# Patient Record
Sex: Male | Born: 1985 | Race: White | Hispanic: No | Marital: Married | Smoking: Current every day smoker
Health system: Southern US, Community
[De-identification: ages and names within clinical notes are randomized; demographics above are authoritative.]

## PROBLEM LIST (undated history)

## (undated) DIAGNOSIS — K259 Gastric ulcer, unspecified as acute or chronic, without hemorrhage or perforation: Secondary | ICD-10-CM

## (undated) DIAGNOSIS — M259 Joint disorder, unspecified: Secondary | ICD-10-CM

## (undated) DIAGNOSIS — I10 Essential (primary) hypertension: Secondary | ICD-10-CM

## (undated) HISTORY — PX: HERNIA REPAIR: SHX51

## (undated) HISTORY — PX: MANDIBLE FRACTURE SURGERY: SHX706

---
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
## 2006-03-19 ENCOUNTER — Emergency Department (HOSPITAL_COMMUNITY): Admission: EM | Admit: 2006-03-19 | Discharge: 2006-03-19 | Payer: Self-pay | Admitting: Emergency Medicine

## 2006-03-23 ENCOUNTER — Ambulatory Visit: Payer: Self-pay | Admitting: Internal Medicine

## 2006-03-23 ENCOUNTER — Ambulatory Visit (HOSPITAL_COMMUNITY): Admission: RE | Admit: 2006-03-23 | Discharge: 2006-03-23 | Payer: Self-pay | Admitting: Internal Medicine

## 2009-11-04 ENCOUNTER — Emergency Department (HOSPITAL_COMMUNITY): Admission: EM | Admit: 2009-11-04 | Discharge: 2009-11-04 | Payer: Self-pay | Admitting: Emergency Medicine
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
## 2012-07-03 ENCOUNTER — Emergency Department (HOSPITAL_COMMUNITY): Payer: Self-pay

## 2012-07-03 ENCOUNTER — Encounter (HOSPITAL_COMMUNITY): Payer: Self-pay

## 2012-07-03 ENCOUNTER — Observation Stay (HOSPITAL_COMMUNITY)
Admission: EM | Admit: 2012-07-03 | Discharge: 2012-07-04 | Disposition: A | Discharge status: Home / Self Care | Payer: Self-pay | Attending: Internal Medicine | Admitting: Internal Medicine

## 2012-07-03 DIAGNOSIS — G9340 Encephalopathy, unspecified: Secondary | ICD-10-CM | POA: Insufficient documentation

## 2012-07-03 DIAGNOSIS — J9600 Acute respiratory failure, unspecified whether with hypoxia or hypercapnia: Principal | ICD-10-CM | POA: Insufficient documentation

## 2012-07-03 DIAGNOSIS — R0602 Shortness of breath: Secondary | ICD-10-CM | POA: Insufficient documentation

## 2012-07-03 DIAGNOSIS — T50901A Poisoning by unspecified drugs, medicaments and biological substances, accidental (unintentional), initial encounter: Secondary | ICD-10-CM | POA: Insufficient documentation

## 2012-07-03 HISTORY — DX: Joint disorder, unspecified: M25.9

## 2012-07-03 LAB — RAPID URINE DRUG SCREEN, HOSP PERFORMED
Amphetamines: NOT DETECTED
Barbiturates: NOT DETECTED
Opiates: POSITIVE — AB
Tetrahydrocannabinol: POSITIVE — AB

## 2012-07-03 LAB — COMPREHENSIVE METABOLIC PANEL
ALT: 27 U/L (ref 0–53)
AST: 24 U/L (ref 0–37)
Albumin: 3.5 g/dL (ref 3.5–5.2)
Alkaline Phosphatase: 80 U/L (ref 39–117)
Calcium: 8.2 mg/dL — ABNORMAL LOW (ref 8.4–10.5)
GFR calc Af Amer: >90 mL/min (ref >90)
Potassium: 3.7 mEq/L (ref 3.5–5.1)
Sodium: 142 mEq/L (ref 135–145)
Total Protein: 6.9 g/dL (ref 6.0–8.3)

## 2012-07-03 LAB — URINALYSIS, ROUTINE W REFLEX MICROSCOPIC
Glucose, UA: NEGATIVE mg/dL
Hgb urine dipstick: NEGATIVE
Ketones, ur: NEGATIVE mg/dL
Leukocytes, UA: NEGATIVE
Protein, ur: NEGATIVE mg/dL

## 2012-07-03 LAB — CBC WITH DIFFERENTIAL/PLATELET
Basophils Relative: 0 % (ref 0–1)
Eosinophils Relative: 2 % (ref 0–5)
HCT: 40.5 % (ref 39.0–52.0)
Hemoglobin: 13.7 g/dL (ref 13.0–17.0)
MCH: 30.4 pg (ref 26.0–34.0)
MCHC: 33.8 g/dL (ref 30.0–36.0)
MCV: 89.8 fL (ref 78.0–100.0)
Monocytes Absolute: 1.1 10*3/uL — ABNORMAL HIGH (ref 0.1–1.0)
Monocytes Relative: 8 % (ref 3–12)
Neutro Abs: 9.6 10*3/uL — ABNORMAL HIGH (ref 1.7–7.7)

## 2012-07-03 MED ORDER — SODIUM CHLORIDE 0.9 % IV SOLN: INTRAVENOUS | Status: DC

## 2012-07-03 MED ORDER — ONDANSETRON HCL 4 MG PO TABS: 4.0000 mg | ORAL_TABLET | Freq: Four times a day (QID) | ORAL | Status: DC | PRN

## 2012-07-03 MED ORDER — ONDANSETRON HCL 4 MG/2ML IJ SOLN: 4.0000 mg | Freq: Four times a day (QID) | INTRAMUSCULAR | Status: DC | PRN

## 2012-07-03 MED ORDER — SODIUM CHLORIDE 0.9 % IJ SOLN: 3.0000 mL | Freq: Two times a day (BID) | INTRAMUSCULAR | Status: DC

## 2012-07-03 MED ORDER — SODIUM CHLORIDE 0.9 % IV BOLUS (SEPSIS)
1000.0000 mL | Freq: Once | INTRAVENOUS | Status: AC
  Administered 2012-07-03: 1000 mL via INTRAVENOUS

## 2012-07-03 MED ORDER — ACETAMINOPHEN 650 MG RE SUPP: 650.0000 mg | Freq: Four times a day (QID) | RECTAL | Status: DC | PRN

## 2012-07-03 MED ORDER — NALOXONE HCL 1 MG/ML IJ SOLN
2.0000 mg | Freq: Once | INTRAMUSCULAR | Status: AC
  Administered 2012-07-03: 2 mg via INTRAVENOUS
  Filled 2012-07-03: qty 2

## 2012-07-03 MED ORDER — VITAMINS A & D EX OINT
TOPICAL_OINTMENT | CUTANEOUS | Status: AC
  Administered 2012-07-03
  Filled 2012-07-03: qty 5

## 2012-07-03 MED ORDER — ACETAMINOPHEN 325 MG PO TABS
650.0000 mg | ORAL_TABLET | Freq: Four times a day (QID) | ORAL | Status: DC | PRN
  Administered 2012-07-04: 650 mg via ORAL
  Filled 2012-07-03: qty 2

## 2012-07-03 MED ORDER — NALOXONE HCL 0.4 MG/ML IJ SOLN: 0.4000 mg | INTRAMUSCULAR | Status: DC | PRN

## 2012-07-03 NOTE — ED Notes (Signed)
Crawford and rn searched pts clothes. Uncapped needle located in back pocket. Needle put in sharps container. Rest of clothes checked and nothing else found.

## 2012-07-03 NOTE — ED Notes (Signed)
Family at bedside. 

## 2012-07-03 NOTE — ED Notes (Signed)
ZOX:WRUE<AV> Expected date:<BR> Expected time:<BR> Means of arrival:<BR> Comments:<BR> Unresponsive

## 2012-07-03 NOTE — ED Notes (Signed)
Patient arouses easily and dozes intermittently. Denies pain or discomfort.

## 2012-07-03 NOTE — ED Notes (Signed)
md at bedside
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
## 2012-07-03 NOTE — ED Notes (Signed)
Pt given urinal and has attempted twice to provide urine. Pt will attempted 1 more time

## 2012-07-03 NOTE — ED Provider Notes (Signed)
History  This chart was scribed for Jack Racer, MD by Bennett Scrape, ED Scribe and Burman Nieves,  ED Scribe. This patient was seen in room RESB/RESB and the patient's care was started at 4:40 PM  CSN: [id number]  Arrival date & time 07/03/12  1639   First MD Initiated Contact with Patient 07/03/12 1640     Level 5 Caveat- AMS  Chief Complaint  Patient presents with  . Drug Overdose    The history is provided by the patient. No language interpreter was used.    Jack Bush is a 27 y.o. male brought in by ambulance in a c-collar and on a LSB, who presents to the Emergency Department after being found unconscious on the bathfloor of [employer] pool hall. Bystanders saw the pt walk into the bathroom, 10 minutes later another bystander opened the door and found him laying on the floor. EMS denies seeing drug paraphernalia but nurse reports finding an uncapped needle in his back pocket. Per EMS, pt had pin point pupils and was unresponsive upon their arrival. He was given 2 mg narcan with rapid improvement and was answering questions upon arrival. Per EMS, pt had an initial episode of apnea with 8 respirations per minute but vitals have been stable otherwise. Pt denies alcohol consumption. He denies being on any daily medications and denies any illegal drug use. He denies having back pain, neck pain, CP and abdominal pain currently.   Past Medical History  Diagnosis Date  . Knee joint disorder     both knees grind when pt walks per pt    Past Surgical History  Procedure Laterality Date  . Hernia repair    . Mandible fracture surgery      Family History  Problem Relation Age of Onset  . Heart attack Father     History  Substance Use Topics  . Smoking status: Current Every Day Smoker -- 0.50 packs/day for 17 years  . Smokeless tobacco: Never Used  . Alcohol Use: No      Review of Systems  Unable to perform ROS: Mental status change    Allergies  Review of  patient's allergies indicates no known allergies.  Home Medications  No current outpatient prescriptions on file.  Triage Vitals: BP 160/83  Pulse 108  Resp 13  SpO2 93%  Physical Exam  Nursing note and vitals reviewed. Constitutional: He is oriented to person, place, and time. He appears well-developed and well-nourished. No distress. Cervical collar and backboard in place.  HENT:  Head: Normocephalic and atraumatic.  Eyes: Conjunctivae and EOM are normal.  2 mm pupils are reactive we  Neck: Neck supple. No tracheal deviation present.  Cardiovascular: Normal rate, regular rhythm and normal heart sounds.  Exam reveals no gallop and no friction rub.   No murmur heard. Pulmonary/Chest: Effort normal and breath sounds normal. No respiratory distress.  Abdominal: Soft. Bowel sounds are normal. There is no tenderness.  Musculoskeletal: Normal range of motion.  Pt moves all extremeties  Neurological: He is alert and oriented to person, place, and time.  5/5 strength throughout, drowsy but arousable    Skin: Skin is warm and dry.  Psychiatric: He has a normal mood and affect. His behavior is normal.    ED Course  Procedures (including critical care time)  DIAGNOSTIC STUDIES: Oxygen Saturation is 93% on room air, adequate by my interpretation.    COORDINATION OF CARE: 4:50 PM- Pt removed from LSB.   5:15 PM- Ordered 1,000  mL of bolus, CT cervical spine, CBC, ethanol, urine rapid drug screen and CT of head.  6:10 PM- Pt rechecked and is more drowsy. O2 sats are dropping. Will redose Narcan.  9:18 PM-Consult complete with Dr. Irene Limbo, [facility]. Patient case explained and discussed. Dr. Irene Limbo agrees to admit patient for further evaluation and treatment to step down. Call ended at 9:19 PM.   Labs Reviewed  COMPREHENSIVE METABOLIC PANEL - Abnormal; Notable for the following:    Glucose, Bld 147 (*)    Calcium 8.2 (*)    Total Bilirubin 0.1 (*)    All other  components within normal limits  CBC WITH DIFFERENTIAL - Abnormal; Notable for the following:    WBC 13.7 (*)    Neutro Abs 9.6 (*)    Monocytes Absolute 1.1 (*)    All other components within normal limits  URINE RAPID DRUG SCREEN (HOSP PERFORMED) - Abnormal; Notable for the following:    Opiates POSITIVE (*)    Cocaine POSITIVE (*)    Benzodiazepines POSITIVE (*)    Tetrahydrocannabinol POSITIVE (*)    All other components within normal limits  SALICYLATE LEVEL - Abnormal; Notable for the following:    Salicylate Lvl <2.0 (*)    All other components within normal limits  MRSA PCR SCREENING  ACETAMINOPHEN LEVEL  ETHANOL  URINALYSIS, ROUTINE W REFLEX MICROSCOPIC  COMPREHENSIVE METABOLIC PANEL  CBC   Ct Head Wo Contrast  07/03/2012  *RADIOLOGY REPORT*  Clinical Data:  Altered mental status, found unconscious on bathroom floor, pinpoint pupils, question drug overdose  CT HEAD WITHOUT CONTRAST CT CERVICAL SPINE WITHOUT CONTRAST  Technique:  Multidetector CT imaging of the head and cervical spine was performed following the standard protocol without intravenous contrast.  Multiplanar CT image reconstructions of the cervical spine were also generated.  Comparison:  CT head 09/08/2004  CT HEAD  Findings: Normal ventricular morphology. No midline shift or mass effect. Normal appearance of brain parenchyma. No intracranial hemorrhage, mass lesion, or acute infarction. Visualized paranasal sinuses and mastoid air cells clear. Bones unremarkable.  IMPRESSION: No acute intracranial abnormalities.  CT CERVICAL SPINE  Findings: Visualized skull base intact. Prevertebral soft tissues normal thickness. Vertebral body and disc space heights maintained. No acute fracture, subluxation, or bone destruction. Bony foramen grossly patent. Numerous normal upper normal-sized cervical nodes. Nasopharyngeal airway present.  IMPRESSION: No acute cervical spine abnormalities.   Original Report Authenticated By: Ulyses Southward, M.D.    Ct Cervical Spine Wo Contrast  07/03/2012  *RADIOLOGY REPORT*  Clinical Data:  Altered mental status, found unconscious on bathroom floor, pinpoint pupils, question drug overdose  CT HEAD WITHOUT CONTRAST CT CERVICAL SPINE WITHOUT CONTRAST  Technique:  Multidetector CT imaging of the head and cervical spine was performed following the standard protocol without intravenous contrast.  Multiplanar CT image reconstructions of the cervical spine were also generated.  Comparison:  CT head 09/08/2004  CT HEAD  Findings: Normal ventricular morphology. No midline shift or mass effect. Normal appearance of brain parenchyma. No intracranial hemorrhage, mass lesion, or acute infarction. Visualized paranasal sinuses and mastoid air cells clear. Bones unremarkable.  IMPRESSION: No acute intracranial abnormalities.  CT CERVICAL SPINE  Findings: Visualized skull base intact. Prevertebral soft tissues normal thickness. Vertebral body and disc space heights maintained. No acute fracture, subluxation, or bone destruction. Bony foramen grossly patent. Numerous normal upper normal-sized cervical nodes. Nasopharyngeal airway present.  IMPRESSION: No acute cervical spine abnormalities.   Original Report Authenticated By: Ulyses Southward, M.D.  1. OD (overdose of drug), initial encounter   2. Accidental drug overdose, initial encounter   3. Acute encephalopathy   4. Acute respiratory failure       MDM  I personally performed the services described in this documentation, which was scribed in my presence. The recorded information has been reviewed and is accurate.       Jack Racer, MD 07/03/12 [phone]

## 2012-07-03 NOTE — Progress Notes (Signed)
Pt noted with confusion States he just woke up and does not know what happened Cm consulted ED RN who reports pt has stated the same twice to other ED staff members and has been oriented x 2 previously.  He has had a visitor per ED RN that left.  Cm re oriented pt again and informed him he would be frequently updated and informed of his test results by [facility] ED staff Pt appreciative.  Pt states he does not have a pcp EPIC lists him as self pay without a pcp

## 2012-07-03 NOTE — ED Notes (Signed)
Patient's father called to check on patient. Patient's father is Azlaan Isidore [phone].

## 2012-07-03 NOTE — ED Notes (Signed)
Per EMS pt was found unresponsive at [facility] pool hall, pt was given narcan 2mg  with pt awaken, pt denies drug or ETOH use, states just got out of jail 2days ago. CBG 217

## 2012-07-04 ENCOUNTER — Observation Stay (HOSPITAL_COMMUNITY): Payer: Self-pay

## 2012-07-04 LAB — COMPREHENSIVE METABOLIC PANEL
ALT: 27 U/L (ref 0–53)
BUN: 8 mg/dL (ref 6–23)
Calcium: 8.5 mg/dL (ref 8.4–10.5)
Creatinine, Ser: 0.95 mg/dL (ref 0.50–1.35)
GFR calc Af Amer: >90 mL/min (ref >90)
GFR calc non Af Amer: >90 mL/min (ref >90)
Glucose, Bld: 93 mg/dL (ref 70–99)
Sodium: 142 mEq/L (ref 135–145)
Total Protein: 6.8 g/dL (ref 6.0–8.3)

## 2012-07-04 LAB — CBC
HCT: 38.6 % — ABNORMAL LOW (ref 39.0–52.0)
Hemoglobin: 13 g/dL (ref 13.0–17.0)
RBC: 4.28 MIL/uL (ref 4.22–5.81)
WBC: 13.6 10*3/uL — ABNORMAL HIGH (ref 4.0–10.5)

## 2012-07-04 LAB — ACETAMINOPHEN LEVEL: Acetaminophen (Tylenol), Serum: <15 ug/mL (ref 10–30)

## 2012-07-04 MED ORDER — BIOTENE DRY MOUTH MT LIQD: 15.0000 mL | Freq: Two times a day (BID) | OROMUCOSAL | Status: DC

## 2012-07-04 NOTE — Discharge Summary (Signed)
[facility]                                                                                   Jack Bush, is a 27 y.o. male  DOB Sep 24, 1985  MRN [id number].  Admission date:  07/03/2012  Discharge Date:  07/04/2012  Primary MD  No primary provider on file.  Admitting Physician  Standley Brooking, MD  Admission Diagnosis  Acute respiratory failure [518.81] Acute encephalopathy [348.30] OD (overdose of drug), initial encounter [977.9, E858.8] Accidental drug overdose, initial encounter [977.9, E858.8]  Discharge Diagnosis     Principal Problem:   Acute respiratory failure Active Problems:   Acute encephalopathy   Accidental drug overdose      Past Medical History  Diagnosis Date  . Knee joint disorder     both knees grind when pt walks per pt    Past Surgical History  Procedure Laterality Date  . Hernia repair    . Mandible fracture surgery       Recommendations for primary care physician for things to follow:      Discharge Diagnoses:   Principal Problem:   Acute respiratory failure Active Problems:   Acute encephalopathy   Accidental drug overdose    Discharge Condition: Stable   Diet recommendation: See Discharge Instructions below   Consults S.work    History of present illness and  Hospital Course:     Kindly see H&P for history of present illness and admission details, please review complete Labs, Consult reports and Test reports for all details in brief Jack Bush, is a 27 y.o. male, patient was admitted for decreased mental status due to toxic encephalopathy from polysubstance abuse, patient was recently released from prison and was found passed out in a public bathroom with a needle with pan positive urine drug screen. His head and C-spine CT scan were negative, he had mild nonspecific leukocytosis with a normal chest x-ray and UA, he is completely symptom free and back to baseline, no subjective complaints  whatsoever, denies any suicidal or homicidal ideations, he has been counseled to abstain from any recreational drug use in the future along with smoking and alcohol use. Social work will see the patient thereafter he will be released home with outpatient followup with PCP.    Today   Subjective:   Jack Bush today has no headache,no chest abdominal pain,no new weakness tingling or numbness, feels much better wants to go home today.    Objective:   Blood pressure 128/66, pulse 87, temperature 99.6 F (37.6 C), temperature source Oral, resp. rate 9, height 6\' 1"  (1.854 m), weight 102.7 kg (226 lb 6.6 oz), SpO2 97.00%.   Intake/Output Summary (Last 24 hours) at 07/04/12 0746 Last data filed at 07/04/12 0600  Gross per 24 hour  Intake    600 ml  Output   1025 ml  Net   -425 ml    Exam Awake Alert, Oriented *3, No new F.N deficits, Normal affect [place].AT,PERRAL Supple Neck,No JVD, No cervical lymphadenopathy appriciated.  Symmetrical Chest wall movement, Good air movement bilaterally, CTAB RRR,No Gallops,Rubs or new Murmurs, No Parasternal Heave +ve B.Sounds, Abd Soft,  Non tender, No organomegaly appriciated, No rebound -guarding or rigidity. No Cyanosis, Clubbing or edema, No new Rash or bruise  Data Review   Major procedures and Radiology Reports - PLEASE review detailed and final reports for all details in brief -       Ct Head Wo Contrast  07/03/2012  *RADIOLOGY REPORT*  Clinical Data:  Altered mental status, found unconscious on bathroom floor, pinpoint pupils, question drug overdose  CT HEAD WITHOUT CONTRAST CT CERVICAL SPINE WITHOUT CONTRAST  Technique:  Multidetector CT imaging of the head and cervical spine was performed following the standard protocol without intravenous contrast.  Multiplanar CT image reconstructions of the cervical spine were also generated.  Comparison:  CT head 09/08/2004  CT HEAD  Findings: Normal ventricular morphology. No midline shift or mass  effect. Normal appearance of brain parenchyma. No intracranial hemorrhage, mass lesion, or acute infarction. Visualized paranasal sinuses and mastoid air cells clear. Bones unremarkable.  IMPRESSION: No acute intracranial abnormalities.  CT CERVICAL SPINE  Findings: Visualized skull base intact. Prevertebral soft tissues normal thickness. Vertebral body and disc space heights maintained. No acute fracture, subluxation, or bone destruction. Bony foramen grossly patent. Numerous normal upper normal-sized cervical nodes. Nasopharyngeal airway present.  IMPRESSION: No acute cervical spine abnormalities.   Original Report Authenticated By: Ulyses Southward, M.D.    Ct Cervical Spine Wo Contrast  07/03/2012  *RADIOLOGY REPORT*  Clinical Data:  Altered mental status, found unconscious on bathroom floor, pinpoint pupils, question drug overdose  CT HEAD WITHOUT CONTRAST CT CERVICAL SPINE WITHOUT CONTRAST  Technique:  Multidetector CT imaging of the head and cervical spine was performed following the standard protocol without intravenous contrast.  Multiplanar CT image reconstructions of the cervical spine were also generated.  Comparison:  CT head 09/08/2004  CT HEAD  Findings: Normal ventricular morphology. No midline shift or mass effect. Normal appearance of brain parenchyma. No intracranial hemorrhage, mass lesion, or acute infarction. Visualized paranasal sinuses and mastoid air cells clear. Bones unremarkable.  IMPRESSION: No acute intracranial abnormalities.  CT CERVICAL SPINE  Findings: Visualized skull base intact. Prevertebral soft tissues normal thickness. Vertebral body and disc space heights maintained. No acute fracture, subluxation, or bone destruction. Bony foramen grossly patent. Numerous normal upper normal-sized cervical nodes. Nasopharyngeal airway present.  IMPRESSION: No acute cervical spine abnormalities.   Original Report Authenticated By: Ulyses Southward, M.D.    Dg Chest Port 1 View  07/04/2012   *RADIOLOGY REPORT*  Clinical Data: Shortness of breath.  PORTABLE CHEST - 1 VIEW  Comparison: Chest x-ray 07/28/2005.  Findings: Lung volumes are normal.  No consolidative airspace disease.  No pleural effusions.  No pneumothorax.  No pulmonary nodule or mass noted.  Pulmonary vasculature and the cardiomediastinal silhouette are within normal limits.  IMPRESSION: 1. No radiographic evidence of acute cardiopulmonary disease.   Original Report Authenticated By: Trudie Reed, M.D.     Micro Results   Follow with Primary MD in 7 days   Get CBC, CMP, checked 7 days by Primary MD and again as instructed by your Primary MD.    Get Medicines reviewed and adjusted.  Please request your Prim.MD to go over all Hospital Tests and Procedure/Radiological results at the follow up, please get all Hospital records sent to your Prim MD by signing hospital release before you go home.  Activity: As tolerated with Full fall precautions use walker/cane & assistance as needed   Diet:  Heart Healthy   For Heart failure patients - Check your Weight  same time everyday, if you gain over 2 pounds, or you develop in leg swelling, experience more shortness of breath or chest pain, call your Primary MD immediately. Follow Cardiac Low Salt Diet and 1.8 lit/day fluid restriction.  Disposition Home   If you experience worsening of your admission symptoms, develop shortness of breath, life threatening emergency, suicidal or homicidal thoughts you must seek medical attention immediately by calling 911 or calling your MD immediately  if symptoms less severe.  You Must read complete instructions/literature along with all the possible adverse reactions/side effects for all the Medicines you take and that have been prescribed to you. Take any new Medicines after you have completely understood and accpet all the possible adverse reactions/side effects.   Do not drive and provide baby sitting services if your were admitted for  syncope or siezures until you have seen by Primary MD or a Neurologist and advised to do so again.  Do not drive when taking Pain medications.    Do not take more than prescribed Pain, Sleep and Anxiety Medications  Special Instructions: If you have smoked or chewed Tobacco  in the last 2 yrs please stop smoking, stop any regular Alcohol  and or any Recreational drug use.  Wear Seat belts while driving.  Recent Results (from the past 240 hour(s))  MRSA PCR SCREENING     Status: None   Collection Time    07/03/12 11:07 PM      Result Value Range Status   MRSA by PCR NEGATIVE  NEGATIVE Final   Comment:            The GeneXpert MRSA Assay (FDA     approved for NASAL specimens     only), is one component of a     comprehensive MRSA colonization     surveillance program. It is not     intended to diagnose MRSA     infection nor to guide or     monitor treatment for     MRSA infections.     CBC w Diff: Lab Results  Component Value Date   WBC 13.6* 07/04/2012   HGB 13.0 07/04/2012   HCT 38.6* 07/04/2012   PLT 304 07/04/2012   LYMPHOPCT 20 07/03/2012   MONOPCT 8 07/03/2012   EOSPCT 2 07/03/2012   BASOPCT 0 07/03/2012    CMP: Lab Results  Component Value Date   NA 142 07/04/2012   K 3.9 07/04/2012   CL 105 07/04/2012   CO2 28 07/04/2012   BUN 8 07/04/2012   CREATININE 0.95 07/04/2012   PROT 6.8 07/04/2012   ALBUMIN 3.5 07/04/2012   BILITOT 0.2* 07/04/2012   ALKPHOS 73 07/04/2012   AST 19 07/04/2012   ALT 27 07/04/2012  .   Discharge Instructions        Follow-up Information   Follow up with Primary MD. Schedule an appointment as soon as possible for a visit in 1 week.        Discharge Medications     Medication List    TAKE these medications       multivitamin with minerals Tabs  Take 1 tablet by mouth every morning.           Total Time in preparing paper work, data evaluation and todays exam - 35 minutes  Leroy Sea M.D on 07/04/2012 at 7:46  AM  [facility] Office  [phone]

## 2012-07-04 NOTE — Progress Notes (Signed)
07/04/12 0000- Nsg note: Pt attempted to void 3 times after admission to room- was successful once with 250cc. I checked post void residual with bladder scanner and it showed 795cc. NP notified and received order for i&o cath. Completed after feeling some resistance when inserting catheter and resulted in 775cc of clear,yellow urine. Will continue to monitor.

## 2012-07-04 NOTE — Progress Notes (Signed)
Pt has been discharge from the SD Unit to home. Has a ride. Pt took all belongings, discharge instruction given, discharge vital sign stable.

## 2012-07-04 NOTE — Clinical Social Work Note (Signed)
CSW attempted to see for substance abuse concerns. Pt was positive for cocaine, opiates, marijuana and benzos.  Pt left prior to seeing CSW.   Doreen Salvage, LCSW ICU/Stepdown Clinical Social Worker [facility] Cell [phone] Hours 8am-1200pm M-F

## 2016-07-26 ENCOUNTER — Ambulatory Visit (INDEPENDENT_AMBULATORY_CARE_PROVIDER_SITE_OTHER): Payer: Self-pay | Admitting: Physician Assistant

## 2021-02-22 ENCOUNTER — Inpatient Hospital Stay (HOSPITAL_COMMUNITY)
Admission: EM | Admit: 2021-02-22 | Discharge: 2021-02-24 | DRG: 382 | Disposition: A | Discharge status: Home / Self Care | Payer: Self-pay | Attending: General Surgery | Admitting: General Surgery

## 2021-02-22 ENCOUNTER — Other Ambulatory Visit: Payer: Self-pay

## 2021-02-22 ENCOUNTER — Emergency Department (HOSPITAL_COMMUNITY): Payer: Self-pay

## 2021-02-22 ENCOUNTER — Inpatient Hospital Stay (HOSPITAL_COMMUNITY): Payer: Self-pay

## 2021-02-22 ENCOUNTER — Encounter (HOSPITAL_COMMUNITY): Payer: Self-pay | Admitting: Emergency Medicine

## 2021-02-22 DIAGNOSIS — D72828 Other elevated white blood cell count: Secondary | ICD-10-CM | POA: Diagnosis present

## 2021-02-22 DIAGNOSIS — K275 Chronic or unspecified peptic ulcer, site unspecified, with perforation: Secondary | ICD-10-CM

## 2021-02-22 DIAGNOSIS — Z4659 Encounter for fitting and adjustment of other gastrointestinal appliance and device: Secondary | ICD-10-CM

## 2021-02-22 DIAGNOSIS — E876 Hypokalemia: Secondary | ICD-10-CM | POA: Diagnosis present

## 2021-02-22 DIAGNOSIS — Z20822 Contact with and (suspected) exposure to covid-19: Secondary | ICD-10-CM | POA: Diagnosis present

## 2021-02-22 DIAGNOSIS — F17210 Nicotine dependence, cigarettes, uncomplicated: Secondary | ICD-10-CM | POA: Diagnosis present

## 2021-02-22 DIAGNOSIS — K255 Chronic or unspecified gastric ulcer with perforation: Principal | ICD-10-CM | POA: Diagnosis present

## 2021-02-22 LAB — RESP PANEL BY RT-PCR (FLU A&B, COVID) ARPGX2
Influenza A by PCR: NEGATIVE
Influenza B by PCR: NEGATIVE
SARS Coronavirus 2 by RT PCR: NEGATIVE

## 2021-02-22 LAB — CBC
HCT: 46.2 % (ref 39.0–52.0)
Hemoglobin: 15.7 g/dL (ref 13.0–17.0)
MCH: 29.5 pg (ref 26.0–34.0)
MCHC: 34 g/dL (ref 30.0–36.0)
MCV: 86.7 fL (ref 80.0–100.0)
Platelets: 451 10*3/uL — ABNORMAL HIGH (ref 150–400)
RBC: 5.33 MIL/uL (ref 4.22–5.81)
RDW: 13.2 % (ref 11.5–15.5)
WBC: 17 10*3/uL — ABNORMAL HIGH (ref 4.0–10.5)
nRBC: 0 % (ref 0.0–0.2)

## 2021-02-22 LAB — COMPREHENSIVE METABOLIC PANEL
ALT: 50 U/L — ABNORMAL HIGH (ref 0–44)
AST: 28 U/L (ref 15–41)
Albumin: 4 g/dL (ref 3.5–5.0)
Alkaline Phosphatase: 81 U/L (ref 38–126)
Anion gap: 11 (ref 5–15)
BUN: 16 mg/dL (ref 6–20)
CO2: 36 mmol/L — ABNORMAL HIGH (ref 22–32)
Calcium: 9.1 mg/dL (ref 8.9–10.3)
Chloride: 90 mmol/L — ABNORMAL LOW (ref 98–111)
Creatinine, Ser: 0.86 mg/dL (ref 0.61–1.24)
GFR, Estimated: >60 mL/min (ref >60)
Glucose, Bld: 116 mg/dL — ABNORMAL HIGH (ref 70–99)
Potassium: 3 mmol/L — ABNORMAL LOW (ref 3.5–5.1)
Sodium: 137 mmol/L (ref 135–145)
Total Bilirubin: 1 mg/dL (ref 0.3–1.2)
Total Protein: 8.4 g/dL — ABNORMAL HIGH (ref 6.5–8.1)

## 2021-02-22 LAB — LIPASE, BLOOD: Lipase: 31 U/L (ref 11–51)

## 2021-02-22 MED ORDER — MORPHINE SULFATE (PF) 4 MG/ML IV SOLN
4.0000 mg | Freq: Once | INTRAVENOUS | Status: AC
  Administered 2021-02-22: 4 mg via INTRAVENOUS
  Filled 2021-02-22: qty 1

## 2021-02-22 MED ORDER — PIPERACILLIN-TAZOBACTAM 3.375 G IVPB
3.3750 g | Freq: Three times a day (TID) | INTRAVENOUS | Status: DC
  Administered 2021-02-23 – 2021-02-24 (×5): 3.375 g via INTRAVENOUS
  Filled 2021-02-22 (×6): qty 50

## 2021-02-22 MED ORDER — PANTOPRAZOLE INFUSION (NEW) - SIMPLE MED
8.0000 mg/h | INTRAVENOUS | Status: DC
Start: 2021-02-22 — End: 2021-02-23
  Administered 2021-02-22: 8 mg/h via INTRAVENOUS
  Filled 2021-02-22: qty 100
  Filled 2021-02-22: qty 80

## 2021-02-22 MED ORDER — PIPERACILLIN-TAZOBACTAM 3.375 G IVPB 30 MIN
3.3750 g | Freq: Once | INTRAVENOUS | Status: AC
  Administered 2021-02-22: 3.375 g via INTRAVENOUS
  Filled 2021-02-22: qty 50

## 2021-02-22 MED ORDER — HYDRALAZINE HCL 20 MG/ML IJ SOLN: 20.0000 mg | INTRAMUSCULAR | Status: DC | PRN

## 2021-02-22 MED ORDER — POTASSIUM CHLORIDE CRYS ER 20 MEQ PO TBCR
40.0000 meq | EXTENDED_RELEASE_TABLET | Freq: Once | ORAL | Status: AC
  Administered 2021-02-22: 40 meq via ORAL
  Filled 2021-02-22: qty 2

## 2021-02-22 MED ORDER — HYDROMORPHONE HCL 1 MG/ML IJ SOLN
1.0000 mg | Freq: Once | INTRAMUSCULAR | Status: AC
  Administered 2021-02-22: 1 mg via INTRAVENOUS
  Filled 2021-02-22: qty 1

## 2021-02-22 MED ORDER — ENOXAPARIN SODIUM 40 MG/0.4ML IJ SOSY
40.0000 mg | PREFILLED_SYRINGE | INTRAMUSCULAR | Status: DC
  Administered 2021-02-22 – 2021-02-23 (×2): 40 mg via SUBCUTANEOUS
  Filled 2021-02-22 (×2): qty 0.4

## 2021-02-22 MED ORDER — LACTATED RINGERS IV BOLUS
1000.0000 mL | Freq: Once | INTRAVENOUS | Status: AC
  Administered 2021-02-22: 1000 mL via INTRAVENOUS

## 2021-02-22 MED ORDER — IOHEXOL 350 MG/ML SOLN: 80.0000 mL | Freq: Once | INTRAVENOUS | Status: DC | PRN

## 2021-02-22 MED ORDER — ONDANSETRON HCL 4 MG/2ML IJ SOLN
4.0000 mg | Freq: Four times a day (QID) | INTRAMUSCULAR | Status: DC | PRN
  Administered 2021-02-22: 4 mg via INTRAVENOUS
  Filled 2021-02-22: qty 2

## 2021-02-22 MED ORDER — ONDANSETRON HCL 4 MG/2ML IJ SOLN
4.0000 mg | Freq: Once | INTRAMUSCULAR | Status: AC
  Administered 2021-02-22: 4 mg via INTRAVENOUS
  Filled 2021-02-22: qty 2

## 2021-02-22 MED ORDER — DIPHENHYDRAMINE HCL 25 MG PO CAPS: 25.0000 mg | ORAL_CAPSULE | Freq: Four times a day (QID) | ORAL | Status: DC | PRN

## 2021-02-22 MED ORDER — MORPHINE SULFATE (PF) 2 MG/ML IV SOLN: 1.0000 mg | INTRAVENOUS | Status: DC | PRN

## 2021-02-22 MED ORDER — ONDANSETRON 4 MG PO TBDP: 4.0000 mg | ORAL_TABLET | Freq: Four times a day (QID) | ORAL | Status: DC | PRN

## 2021-02-22 MED ORDER — PANTOPRAZOLE 80MG IVPB - SIMPLE MED
80.0000 mg | Freq: Once | INTRAVENOUS | Status: AC
  Administered 2021-02-22: 80 mg via INTRAVENOUS
  Filled 2021-02-22: qty 80

## 2021-02-22 MED ORDER — DIPHENHYDRAMINE HCL 50 MG/ML IJ SOLN: 25.0000 mg | Freq: Four times a day (QID) | INTRAMUSCULAR | Status: DC | PRN

## 2021-02-22 MED ORDER — HYDROMORPHONE HCL 1 MG/ML IJ SOLN
1.0000 mg | INTRAMUSCULAR | Status: DC | PRN
  Administered 2021-02-22 – 2021-02-23 (×3): 1 mg via INTRAVENOUS
  Filled 2021-02-22 (×4): qty 1

## 2021-02-22 MED ORDER — POTASSIUM CHLORIDE IN NACL 20-0.9 MEQ/L-% IV SOLN
INTRAVENOUS | Status: DC
  Filled 2021-02-22 (×4): qty 1000

## 2021-02-22 MED ORDER — PANTOPRAZOLE SODIUM 40 MG IV SOLR
40.0000 mg | Freq: Once | INTRAVENOUS | Status: AC
  Administered 2021-02-22: 40 mg via INTRAVENOUS
  Filled 2021-02-22: qty 40

## 2021-02-22 MED ORDER — PANTOPRAZOLE SODIUM 40 MG IV SOLR
40.0000 mg | Freq: Two times a day (BID) | INTRAVENOUS | Status: DC
Start: 2021-02-26 — End: 2021-02-24

## 2021-02-22 NOTE — ED Triage Notes (Signed)
Pt states that he has had n/v since Saturday with RUQ abdominal pain. No hx of same. Alert and oriented.

## 2021-02-22 NOTE — ED Provider Notes (Signed)
[facility] EMERGENCY DEPARTMENT Provider Note  CSN: [id number] Arrival date & time: 02/22/21 1244    History Chief Complaint  Patient presents with  . Abdominal Pain    Jack Bush is a 35 y.o. male with no significant PMH reports about a week of persistent, waxing and waning, sharp RUQ pain, worse with eating, improved with vomiting. Not associated with fever, CP, SOB or diarrhea. No prior history of same, no known liver disease (including hepatitis, has remote history of IVDA).    Past Medical History:  Diagnosis Date  . Knee joint disorder    both knees grind when pt walks per pt    Past Surgical History:  Procedure Laterality Date  . HERNIA REPAIR    . MANDIBLE FRACTURE SURGERY      Family History  Problem Relation Age of Onset  . Heart attack Father     Social History   Tobacco Use  . Smoking status: Every Day    Packs/day: 0.50    Years: 17.00    Pack years: 8.50    Types: Cigarettes  . Smokeless tobacco: Never  Substance Use Topics  . Alcohol use: No  . Drug use: Yes    Types: Marijuana, Heroin    Comment: 07/03/12 -out of jail 2 days ago - had both drugs x 1 since     Home Medications Prior to Admission medications   Medication Sig Start Date End Date Taking? Authorizing Provider  Multiple Vitamin (MULTIVITAMIN WITH MINERALS) TABS Take 1 tablet by mouth every morning.    [provider]     Allergies    Patient has no known allergies.   Review of Systems   Review of Systems A comprehensive review of systems was completed and negative except as noted in HPI.    Physical Exam BP (!) 141/97 (BP Location: Right Arm)   Pulse 85   Temp 97.9 F (36.6 C) (Oral)   Resp 12   Ht 6\' 1"  (1.854 m)   Wt 99.8 kg   SpO2 92%   BMI 29.03 kg/m   Physical Exam Vitals and nursing note reviewed.  Constitutional:      Appearance: Normal appearance.  HENT:     Head: Normocephalic and atraumatic.     Nose: Nose normal.      Mouth/Throat:     Mouth: Mucous membranes are moist.  Eyes:     Extraocular Movements: Extraocular movements intact.     Conjunctiva/sclera: Conjunctivae normal.  Cardiovascular:     Rate and Rhythm: Normal rate.  Pulmonary:     Effort: Pulmonary effort is normal.     Breath sounds: Normal breath sounds.  Abdominal:     General: Abdomen is flat.     Palpations: Abdomen is soft.     Tenderness: There is abdominal tenderness in the right upper quadrant. There is guarding. Positive signs include Murphy's sign. Negative signs include McBurney's sign.  Musculoskeletal:        General: No swelling. Normal range of motion.     Cervical back: Neck supple.  Skin:    General: Skin is warm and dry.  Neurological:     General: No focal deficit present.     Mental Status: He is alert.  Psychiatric:        Mood and Affect: Mood normal.     ED Results / Procedures / Treatments   Labs (all labs ordered are listed, but only abnormal results are displayed) Labs Reviewed  LIPASE,  BLOOD  COMPREHENSIVE METABOLIC PANEL  CBC  URINALYSIS, ROUTINE W REFLEX MICROSCOPIC    EKG None  Radiology No results found.  Procedures Procedures  Medications Ordered in the ED Medications  ondansetron (ZOFRAN) injection 4 mg (has no administration in time range)  morphine 4 MG/ML injection 4 mg (has no administration in time range)     MDM Rules/Calculators/A&P MDM Patient here with RUQ pain, positive Murphy's most concerning for biliary disease. Will check labs, send for [place], may need CT if not conclusive. Pain/nausea meds and IVF for comfort.   ED Course  I have reviewed the triage vital signs and the nursing notes.  Pertinent labs & imaging results that were available during my care of the patient were reviewed by me and considered in my medical decision making (see chart for details).  Clinical Course as of 02/22/21 1635  Tue Feb 22, 2021  1456 Patient with difficult IV access. 20ga placed  in L EJ with ultrasound guidance after unsuccessful attempts at R EJ.  [CS]  1540 CBC shows a leukocytosis. [place] with a dilated GB but no signs of cholecystitis.  [CS]  1613 CMP with mild hypokalemia, likely from poor intake. Otherwise no signs of biliary obstruction. Will send for CT to further evaluate other causes of his pain.  [CS]  1634 Care of the patient signed out to Dr. Charm Barges at the change of shift pending CT.  [CS]    Clinical Course User Index [CS] Pollyann Savoy, MD    Final Clinical Impression(s) / ED Diagnoses Final diagnoses:  None    Rx / DC Orders ED Discharge Orders     None        Pollyann Savoy, MD 02/22/21 1635

## 2021-02-22 NOTE — ED Notes (Signed)
ED TO INPATIENT HANDOFF REPORT  ED Nurse Name and Phone #: [phone], Charise Carwin, RN   S Name/Age/Gender Jack Bush 35 y.o. male Room/Bed: WA10/WA10  Code Status   Code Status: Prior  Home/SNF/Other Home Patient oriented to: self, place, time, and situation Is this baseline? Yes   Triage Complete: Triage complete  Chief Complaint Perforated gastric ulcer (HCC) [K25.5]  Triage Note Pt states that he has had n/v since Saturday with RUQ abdominal pain. No hx of same. Alert and oriented.   Allergies No Known Allergies  Level of Care/Admitting Diagnosis ED Disposition     ED Disposition  Admit   Condition  --   Comment  Hospital Area: [facility] [[id number]]  Level of Care: Med-Surg [16]  May admit patient to [facility] or [facility] if equivalent level of care is available:: No  Covid Evaluation: Asymptomatic Screening Protocol (No Symptoms)  Diagnosis: Perforated gastric ulcer (HCC) [244010]  Admitting Physician: Abigail Miyamoto [[id number]]  Attending Physician: CCS, MD [[id number]]  Estimated length of stay: 5 - 7 days  Certification:: I certify this patient will need inpatient services for at least 2 midnights          B Medical/Surgery History Past Medical History:  Diagnosis Date   Knee joint disorder    both knees grind when pt walks per pt   Past Surgical History:  Procedure Laterality Date   HERNIA REPAIR     MANDIBLE FRACTURE SURGERY       A IV Location/Drains/Wounds Patient Lines/Drains/Airways Status     Active Line/Drains/Airways     Name Placement date Placement time Site Days   Peripheral IV 02/22/21 20 G Anterior;Left External jugular 02/22/21  1501  External jugular  less than 1            Intake/Output Last 24 hours No intake or output data in the 24 hours ending 02/22/21 1947  Labs/Imaging Results for orders placed or performed during the hospital encounter of 02/22/21 (from the past 48 hour(s))  Lipase,  blood     Status: None   Collection Time: 02/22/21  3:09 PM  Result Value Ref Range   Lipase 31 11 - 51 U/L    Comment: Performed at [facility], [address], [place], [place] [address]  Comprehensive metabolic panel     Status: Abnormal   Collection Time: 02/22/21  3:09 PM  Result Value Ref Range   Sodium 137 135 - 145 mmol/L   Potassium 3.0 (L) 3.5 - 5.1 mmol/L   Chloride 90 (L) 98 - 111 mmol/L   CO2 36 (H) 22 - 32 mmol/L   Glucose, Bld 116 (H) 70 - 99 mg/dL    Comment: Glucose reference range applies only to samples taken after fasting for at least 8 hours.   BUN 16 6 - 20 mg/dL   Creatinine, Ser 6.64 0.61 - 1.24 mg/dL   Calcium 9.1 8.9 - 40.3 mg/dL   Total Protein 8.4 (H) 6.5 - 8.1 g/dL   Albumin 4.0 3.5 - 5.0 g/dL   AST 28 15 - 41 U/L   ALT 50 (H) 0 - 44 U/L   Alkaline Phosphatase 81 38 - 126 U/L   Total Bilirubin 1.0 0.3 - 1.2 mg/dL   GFR, Estimated >47 >42 mL/min    Comment: (NOTE) Calculated using the CKD-EPI Creatinine Equation (2021)    Anion gap 11 5 - 15    Comment: Performed at [facility], [address] [place], [place] [address]  CBC     Status: Abnormal   Collection Time: 02/22/21  3:09 PM  Result Value Ref Range   WBC 17.0 (H) 4.0 - 10.5 K/uL   RBC 5.33 4.22 - 5.81 MIL/uL   Hemoglobin 15.7 13.0 - 17.0 g/dL   HCT 20.9 47.0 - 96.2 %   MCV 86.7 80.0 - 100.0 fL   MCH 29.5 26.0 - 34.0 pg   MCHC 34.0 30.0 - 36.0 g/dL   RDW 83.6 62.9 - 47.6 %   Platelets 451 (H) 150 - 400 K/uL   nRBC 0.0 0.0 - 0.2 %    Comment: Performed at [facility], [address] [place], [place] [address]  Resp Panel by RT-PCR (Flu A&B, Covid) Nasopharyngeal Swab     Status: None   Collection Time: 02/22/21  6:17 PM   Specimen: Nasopharyngeal Swab; Nasopharyngeal(NP) swabs in vial transport medium  Result Value Ref Range   SARS Coronavirus 2 by RT PCR NEGATIVE NEGATIVE    Comment: (NOTE) SARS-CoV-2 target nucleic acids  are NOT DETECTED.  The SARS-CoV-2 RNA is generally detectable in upper respiratory specimens during the acute phase of infection. The lowest concentration of SARS-CoV-2 viral copies this assay can detect is 138 copies/mL. A negative result does not preclude SARS-Cov-2 infection and should not be used as the sole basis for treatment or other patient management decisions. A negative result may occur with  improper specimen collection/handling, submission of specimen other than nasopharyngeal swab, presence of viral mutation(s) within the areas targeted by this assay, and inadequate number of viral copies(<138 copies/mL). A negative result must be combined with clinical observations, patient history, and epidemiological information. The expected result is Negative.  Fact Sheet for Patients:  BloggerCourse.com  Fact Sheet for Healthcare Providers:  SeriousBroker.it  This test is no t yet approved or cleared by the [place] FDA and  has been authorized for detection and/or diagnosis of SARS-CoV-2 by FDA under an Emergency Use Authorization (EUA). This EUA will remain  in effect (meaning this test can be used) for the duration of the COVID-19 declaration under Section 564(b)(1) of the Act, 21 U.S.C.section 360bbb-3(b)(1), unless the authorization is terminated  or revoked sooner.       Influenza A by PCR NEGATIVE NEGATIVE   Influenza B by PCR NEGATIVE NEGATIVE    Comment: (NOTE) The Xpert Xpress SARS-CoV-2/FLU/RSV plus assay is intended as an aid in the diagnosis of influenza from Nasopharyngeal swab specimens and should not be used as a sole basis for treatment. Nasal washings and aspirates are unacceptable for Xpert Xpress SARS-CoV-2/FLU/RSV testing.  Fact Sheet for Patients: BloggerCourse.com  Fact Sheet for Healthcare Providers: SeriousBroker.it  This test is not yet  approved or cleared by the [place] FDA and has been authorized for detection and/or diagnosis of SARS-CoV-2 by FDA under an Emergency Use Authorization (EUA). This EUA will remain in effect (meaning this test can be used) for the duration of the COVID-19 declaration under Section 564(b)(1) of the Act, 21 U.S.C. section 360bbb-3(b)(1), unless the authorization is terminated or revoked.  Performed at [facility], [address] [place], [place] [address]    CT ABDOMEN PELVIS WO CONTRAST  Result Date: 02/22/2021 CLINICAL DATA:  Right upper quadrant pain leukocytosis. EXAM: CT ABDOMEN AND PELVIS WITHOUT CONTRAST TECHNIQUE: Multidetector CT imaging of the abdomen and pelvis was performed following the standard protocol without IV contrast. COMPARISON:  Same day abdominal  ultrasound. FINDINGS: Lower chest: No acute abnormality. Hepatobiliary: Diffuse hepatic steatosis. Gallbladder is distended without wall thickening or pericholecystic fluid. No biliary ductal dilation. Pancreas: Inflammatory stranding along the pancreas which is favored reactive related to the perforated peptic ulcer. No pancreatic ductal dilation. Spleen: Within normal limits. Adrenals/Urinary Tract: Bilateral adrenal glands are unremarkable. No hydronephrosis. No renal, ureteral or bladder calculus visualized. Urinary bladder is unremarkable for degree of distension. Stomach/Bowel: There is a defect in the gastric antrum with contained gas and fluid in the lesser sac of the stomach pelvis well as adjacent inflammatory stranding which extends into the retroperitoneum and along the mesenteric root. Vascular/Lymphatic: No abdominal aortic aneurysm. No pathologically enlarged abdominal or pelvic lymph nodes. Reproductive: Prostate is unremarkable. Other: No abdominal wall hernia. Musculoskeletal: No acute or significant osseous findings. IMPRESSION: 1. Perforated peptic ulcer with contained gas and fluid in the  lesser sac of the stomach and adjacent inflammatory stranding which extends into the retroperitoneum and along the mesenteric root. 2. Inflammatory stranding along the pancreas is favored reactive related to the perforated peptic ulcer. 3. Diffuse hepatic steatosis. These results were called by telephone at the time of interpretation on 02/22/2021 at 5:03 pm to provider [facility] , who verbally acknowledged these results. Electronically Signed   By: Maudry Mayhew M.D.   On: 02/22/2021 17:05   US Abdomen Limited  Result Date: 02/22/2021 CLINICAL DATA:  RUQ pain, vomiting, eval biliary disease EXAM: ULTRASOUND ABDOMEN LIMITED RIGHT UPPER QUADRANT COMPARISON:  None. FINDINGS: Gallbladder: No gallstones or wall thickening visualized. Gallbladder is distended without pericholecystic fluid. No sonographic Murphy sign noted by sonographer. Common bile duct: Diameter: 5 mm, within normal limits Liver: No focal lesion identified. Diffusely increased hepatic parenchymal echogenicity. Portal vein is patent on color Doppler imaging with normal direction of blood flow towards the liver. Other: None. IMPRESSION: 1. Hepatic steatosis. 2. No sonographic evidence of acute cholecystitis. Gallbladder is distended which can be seen in NPO state. Electronically Signed   By: Meda Klinefelter M.D.   On: 02/22/2021 14:44    Pending Labs Unresulted Labs (From admission, onward)     Start     Ordered   02/22/21 1259  Urinalysis, Routine w reflex microscopic  Once,   STAT        02/22/21 1258   Signed and Held  HIV Antibody (routine testing w rflx)  (HIV Antibody (Routine testing w reflex) panel)  Once,   R        Signed and Held   Signed and Held  Basic metabolic panel  Tomorrow morning,   R        Signed and Held   Signed and Held  CBC  Tomorrow morning,   R        Signed and Held            Vitals/Pain Today's Vitals   02/22/21 1900 02/22/21 1915 02/22/21 1930 02/22/21 1945  BP: (!) 133/94 (!) 148/99  (!) 148/84 (!) 147/99  Pulse: 88 70 74 76  Resp: 13 (!) 4 20 13   Temp:    97.8 F (36.6 C)  TempSrc:    Oral  SpO2: 99% 100% 97% 100%  Weight:      Height:      PainSc:        Isolation Precautions No active isolations  Medications Medications  iohexol (OMNIPAQUE) 350 MG/ML injection 80 mL (has no administration in time range)  ondansetron (ZOFRAN) injection 4 mg (4 mg Intravenous Given  02/22/21 1501)  morphine 4 MG/ML injection 4 mg (4 mg Intravenous Given 02/22/21 1502)  lactated ringers bolus 1,000 mL (1,000 mLs Intravenous New Bag/Given 02/22/21 1531)  potassium chloride SA (KLOR-CON) CR tablet 40 mEq (40 mEq Oral Given 02/22/21 1645)  piperacillin-tazobactam (ZOSYN) IVPB 3.375 g (3.375 g Intravenous New Bag/Given 02/22/21 1800)  pantoprazole (PROTONIX) injection 40 mg (40 mg Intravenous Given 02/22/21 1801)  HYDROmorphone (DILAUDID) injection 1 mg (1 mg Intravenous Given 02/22/21 1801)    Mobility walks Low fall risk   Focused Assessments    R Recommendations: See Admitting Provider Note  Report given to:   Additional Notes:

## 2021-02-22 NOTE — ED Notes (Signed)
Per report from day shift "Uncertain if patient will need an NG tube" will confirm with admitting doctor before proceeding.

## 2021-02-22 NOTE — ED Notes (Signed)
EDP at the bedside for US guided IV start.

## 2021-02-22 NOTE — ED Notes (Signed)
Pt to CT at this time.

## 2021-02-22 NOTE — Progress Notes (Addendum)
NGT Placed #16. Checked via air placement by 2 RN's. Ordered placed  for bedside X Ray to verify NGT placement.

## 2021-02-22 NOTE — ED Provider Notes (Signed)
Signout from Dr. Renae Gloss.  35 year old male here with right upper quadrant abdominal pain x1 week.  Labs showing elevated white count low potassium.  Ultrasound unremarkable.  Pending CT abdomen and pelvis.  Disposition per results of CT.  Physical Exam  BP (!) 161/104   Pulse 67   Temp 97.9 F (36.6 C) (Oral)   Resp 16   Ht 6\' 1"  (1.854 m)   Wt 99.8 kg   SpO2 99%   BMI 29.03 kg/m   Physical Exam  ED Course/Procedures   Clinical Course as of 02/22/21 1711  Tue Feb 22, 2021  1456 Patient with difficult IV access. 20ga placed in L EJ with ultrasound guidance after unsuccessful attempts at R EJ.  [CS]  1540 CBC shows a leukocytosis. Feb 24, 2021 with a dilated GB but no signs of cholecystitis.  [CS]  1613 CMP with mild hypokalemia, likely from poor intake. Otherwise no signs of biliary obstruction. Will send for CT to further evaluate other causes of his pain.  [CS]  1634 Care of the patient signed out to Dr. [place] at the change of shift pending CT.  [CS]    Clinical Course User Index [CS] Charm Barges, MD    Procedures  MDM  Received a call from radiology that patient has a perforated gastric ulcer.  I reviewed the history with the patient.  He is a smoker denies any alcohol or regular NSAID use.  Pains been going on over a week.  Rates the pain as 9 out of 10 currently.  Will make NPO.  PPI ordered.  Antibiotics ordered.  More pain medicine.  General surgery consulted.  1710 -discussed with Dr. Pollyann Savoy general surgery who will evaluate the patient.       Magnus Ivan, MD 02/23/21 1227

## 2021-02-22 NOTE — ED Notes (Signed)
Pt is a difficult stick. EDP notified and aware. IV team consult placed. [place] tech at the bedside at this time for abd [place].

## 2021-02-22 NOTE — Progress Notes (Signed)
NGT  #16 R nare placement in stomach. Radiologists verified and recommended to advance. Advance completed.

## 2021-02-22 NOTE — ED Provider Notes (Signed)
Emergency Medicine Provider Triage Evaluation Note  Jack Bush , a 35 y.o. male  was evaluated in triage.  Pt complains of upper abdominal pain over the last week.  He reports associated intractable nausea and vomiting, subjective fever, chills.  He denies any chest pain or shortness of breath, urinary complaints, diarrhea.  Review of Systems  Positive:  Negative: See above  Physical Exam  BP (!) 145/122 (BP Location: Left Arm)   Pulse (!) 108   Temp 98.2 F (36.8 C) (Oral)   Resp 18   Ht 6\' 1"  (1.854 m)   Wt 99.8 kg   SpO2 100%   BMI 29.03 kg/m  Gen:   Awake, no distress   Resp:  Normal effort  MSK:   Moves extremities without difficulty  Other:  Upper abdominal tenderness which is worse in the right upper quadrant and epigastrium.  Bowel sounds are normal.  Medical Decision Making  Medically screening exam initiated at 1:16 PM.  Appropriate orders placed.  COUGAR IMEL was informed that the remainder of the evaluation will be completed by another provider, this initial triage assessment does not replace that evaluation, and the importance of remaining in the ED until their evaluation is complete.     Lovena Le [place], PA-C 02/22/21 1317    02/24/21, MD 02/22/21 [phone]

## 2021-02-22 NOTE — ED Notes (Signed)
General surgery at the bedside.

## 2021-02-22 NOTE — H&P (Signed)
Jack Bush is an 35 y.o. male.   Chief Complaint: RUQ abdominal pain HPI: This is a 35 year old gentleman who presents with a 10-day history of right upper quadrant abdominal pain, nausea, and vomiting.  He reports that this started suddenly over a week ago Saturday.  He describes the pain as sharp and severe but intermittent.  He has had vomiting every day.  He has used 2 large bottles of Pepto-Bismol over the past week.  He denies use of NSAIDs or other anti-inflammatories.  He has no previous history of ulcer disease.  He does smoke and has a previous history of drug use.  He presented to the emergency department earlier today.  He first had an ultrasound which ruled out gallstones.  He then had a CAT scan of his abdomen and pelvis without contrast showing a perforated gastric ulcer into the lesser sac with some air and fluid and inflammatory changes but no free air or free fluid.  He has been having bowel movements.  He reports that his pain is no worse than it was 10 days ago.  He denies fevers or chills.  Past Medical History:  Diagnosis Date   Knee joint disorder    both knees grind when pt walks per pt    Past Surgical History:  Procedure Laterality Date   HERNIA REPAIR     MANDIBLE FRACTURE SURGERY      Family History  Problem Relation Age of Onset   Heart attack Father    Social History:  reports that he has been smoking. He has a 8.50 pack-year smoking history. He has never used smokeless tobacco. 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 He reports that he does not drink alcohol.  Allergies: No Known Allergies  (Not in a hospital admission)   Results for orders placed or performed during the hospital encounter of 02/22/21 (from the past 48 hour(s))  Lipase, blood     Status: None   Collection Time: 02/22/21  3:09 PM  Result Value Ref Range   Lipase 31 11 - 51 U/L    Comment: Performed at [facility], [address],  [place], [place] [address]  Comprehensive metabolic panel     Status: Abnormal   Collection Time: 02/22/21  3:09 PM  Result Value Ref Range   Sodium 137 135 - 145 mmol/L   Potassium 3.0 (L) 3.5 - 5.1 mmol/L   Chloride 90 (L) 98 - 111 mmol/L   CO2 36 (H) 22 - 32 mmol/L   Glucose, Bld 116 (H) 70 - 99 mg/dL    Comment: Glucose reference range applies only to samples taken after fasting for at least 8 hours.   BUN 16 6 - 20 mg/dL   Creatinine, Ser 4.25 0.61 - 1.24 mg/dL   Calcium 9.1 8.9 - 95.6 mg/dL   Total Protein 8.4 (H) 6.5 - 8.1 g/dL   Albumin 4.0 3.5 - 5.0 g/dL   AST 28 15 - 41 U/L   ALT 50 (H) 0 - 44 U/L   Alkaline Phosphatase 81 38 - 126 U/L   Total Bilirubin 1.0 0.3 - 1.2 mg/dL   GFR, Estimated >38 >75 mL/min    Comment: (NOTE) Calculated using the CKD-EPI Creatinine Equation (2021)    Anion gap 11 5 - 15    Comment: Performed at [facility], [address], [place], [place] [address]  CBC     Status: Abnormal   Collection Time: 02/22/21  3:09 PM  Result Value Ref Range   WBC 17.0 (H) 4.0 - 10.5 K/uL   RBC 5.33 4.22 - 5.81 MIL/uL   Hemoglobin 15.7 13.0 - 17.0 g/dL   HCT 25.8 52.7 - 78.2 %   MCV 86.7 80.0 - 100.0 fL   MCH 29.5 26.0 - 34.0 pg   MCHC 34.0 30.0 - 36.0 g/dL   RDW 42.3 53.6 - 14.4 %   Platelets 451 (H) 150 - 400 K/uL   nRBC 0.0 0.0 - 0.2 %    Comment: Performed at [facility], [address], [place], [place] [address]   CT ABDOMEN PELVIS WO CONTRAST  Result Date: 02/22/2021 CLINICAL DATA:  Right upper quadrant pain leukocytosis. EXAM: CT ABDOMEN AND PELVIS WITHOUT CONTRAST TECHNIQUE: Multidetector CT imaging of the abdomen and pelvis was performed following the standard protocol without IV contrast. COMPARISON:  Same day abdominal ultrasound. FINDINGS: Lower chest: No acute abnormality. Hepatobiliary: Diffuse hepatic steatosis. Gallbladder is distended without wall thickening or pericholecystic fluid. No biliary ductal  dilation. Pancreas: Inflammatory stranding along the pancreas which is favored reactive related to the perforated peptic ulcer. No pancreatic ductal dilation. Spleen: Within normal limits. Adrenals/Urinary Tract: Bilateral adrenal glands are unremarkable. No hydronephrosis. No renal, ureteral or bladder calculus visualized. Urinary bladder is unremarkable for degree of distension. Stomach/Bowel: There is a defect in the gastric antrum with contained gas and fluid in the lesser sac of the stomach pelvis well as adjacent inflammatory stranding which extends into the retroperitoneum and along the mesenteric root. Vascular/Lymphatic: No abdominal aortic aneurysm. No pathologically enlarged abdominal or pelvic lymph nodes. Reproductive: Prostate is unremarkable. Other: No abdominal wall hernia. Musculoskeletal: No acute or significant osseous findings. IMPRESSION: 1. Perforated peptic ulcer with contained gas and fluid in the lesser sac of the stomach and adjacent inflammatory stranding which extends into the retroperitoneum and along the mesenteric root. 2. Inflammatory stranding along the pancreas is favored reactive related to the perforated peptic ulcer. 3. Diffuse hepatic steatosis. These results were called by telephone at the time of interpretation on 02/22/2021 at 5:03 pm to provider [facility] , who verbally acknowledged these results. Electronically Signed   By: Maudry Mayhew M.D.   On: 02/22/2021 17:05   US Abdomen Limited  Result Date: 02/22/2021 CLINICAL DATA:  RUQ pain, vomiting, eval biliary disease EXAM: ULTRASOUND ABDOMEN LIMITED RIGHT UPPER QUADRANT COMPARISON:  None. FINDINGS: Gallbladder: No gallstones or wall thickening visualized. Gallbladder is distended without pericholecystic fluid. No sonographic Murphy sign noted by sonographer. Common bile duct: Diameter: 5 mm, within normal limits Liver: No focal lesion identified. Diffusely increased hepatic parenchymal echogenicity. Portal vein is  patent on color Doppler imaging with normal direction of blood flow towards the liver. Other: None. IMPRESSION: 1. Hepatic steatosis. 2. No sonographic evidence of acute cholecystitis. Gallbladder is distended which can be seen in NPO state. Electronically Signed   By: Meda Klinefelter M.D.   On: 02/22/2021 14:44    Review of Systems  All other systems reviewed and are negative.  Blood pressure (!) 158/100, pulse 65, temperature 97.9 F (36.6 C), temperature source Oral, resp. rate 13, height 6\' 1"  (1.854 m), weight 99.8 kg, SpO2 98 %. Physical Exam Constitutional:      Appearance: He is not ill-appearing.     Comments: He appears uncomfortable  HENT:     Head: Normocephalic and atraumatic.     Mouth/Throat:     Comments: Dry Cardiovascular:     Rate and Rhythm:  Normal rate and regular rhythm.     Heart sounds: Normal heart sounds. No murmur heard. Pulmonary:     Effort: Pulmonary effort is normal. No respiratory distress.     Breath sounds: Normal breath sounds.  Abdominal:     General: Abdomen is flat.     Hernia: No hernia is present.     Comments: His abdomen is soft and surprisingly minimally tender mostly in the epigastrium with only mild guarding.  The rest of his abdomen is nontender.  He is not rigid and there is no gross peritonitis  Skin:    General: Skin is warm and dry.     Coloration: Skin is not jaundiced.  Neurological:     General: No focal deficit present.     Mental Status: He is alert and oriented to person, place, and time.  Psychiatric:        Mood and Affect: Mood is anxious.        Behavior: Behavior normal.     Assessment/Plan Perforated gastric ulcer  I have reviewed his CT scan of the abdomen pelvis and his laboratory data.  He appears to have a contained perforation of a gastric ulcer into the lesser sac.  I discussed this with the patient and his wife.  I have also discussed the CT findings with my partner Dr. Janee Morn.  Given this is now 10 days  into his symptoms and I suspect that he perforated at the start of this and given his exam of the abdomen, I would like to attempt nonoperative management with nasogastric suctioning, bowel rest, and IV antibiotics.  He understands that if he deteriorates or nonoperative management fails that he would require an exploratory laparotomy.  I will place him on IV Protonix as well.  Hopefully he will improve without the need for acute surgical intervention and then an upper endoscopy can be performed in 6 to 8 weeks.  Should he acutely worsen this evening, we will proceed to the operating room.  He and his wife agree with the plans.  Abigail Miyamoto, MD 02/22/2021, 6:26 PM

## 2021-02-22 NOTE — ED Notes (Addendum)
Hospitalist contacted in regards to conflicting orders with report given by day shift RN regarding placement of NG tube.

## 2021-02-23 ENCOUNTER — Inpatient Hospital Stay (HOSPITAL_COMMUNITY): Payer: Self-pay

## 2021-02-23 LAB — CBC
HCT: 46.7 % (ref 39.0–52.0)
Hemoglobin: 15 g/dL (ref 13.0–17.0)
MCH: 29.5 pg (ref 26.0–34.0)
MCHC: 32.1 g/dL (ref 30.0–36.0)
MCV: 91.7 fL (ref 80.0–100.0)
Platelets: 341 10*3/uL (ref 150–400)
RBC: 5.09 MIL/uL (ref 4.22–5.81)
RDW: 13.2 % (ref 11.5–15.5)
WBC: 14.1 10*3/uL — ABNORMAL HIGH (ref 4.0–10.5)
nRBC: 0 % (ref 0.0–0.2)

## 2021-02-23 LAB — BASIC METABOLIC PANEL
Anion gap: 10 (ref 5–15)
BUN: 18 mg/dL (ref 6–20)
CO2: 28 mmol/L (ref 22–32)
Calcium: 8.8 mg/dL — ABNORMAL LOW (ref 8.9–10.3)
Chloride: 96 mmol/L — ABNORMAL LOW (ref 98–111)
Creatinine, Ser: 1.2 mg/dL (ref 0.61–1.24)
GFR, Estimated: >60 mL/min (ref >60)
Glucose, Bld: 102 mg/dL — ABNORMAL HIGH (ref 70–99)
Potassium: 3.7 mmol/L (ref 3.5–5.1)
Sodium: 134 mmol/L — ABNORMAL LOW (ref 135–145)

## 2021-02-23 LAB — HIV ANTIBODY (ROUTINE TESTING W REFLEX): HIV Screen 4th Generation wRfx: NONREACTIVE

## 2021-02-23 MED ORDER — IOHEXOL 300 MG/ML  SOLN
100.0000 mL | Freq: Once | INTRAMUSCULAR | Status: AC | PRN
  Administered 2021-02-23: 100 mL

## 2021-02-23 MED ORDER — SODIUM CHLORIDE 0.9% FLUSH: 10.0000 mL | INTRAVENOUS | Status: DC | PRN

## 2021-02-23 MED ORDER — CHLORHEXIDINE GLUCONATE CLOTH 2 % EX PADS
6.0000 | MEDICATED_PAD | Freq: Every day | CUTANEOUS | Status: DC
  Administered 2021-02-23 – 2021-02-24 (×2): 6 via TOPICAL

## 2021-02-23 MED ORDER — SODIUM CHLORIDE 0.9% FLUSH
10.0000 mL | Freq: Two times a day (BID) | INTRAVENOUS | Status: DC
  Administered 2021-02-23 (×2): 10 mL

## 2021-02-23 NOTE — Progress Notes (Addendum)
Zosyn and protonix are listed as variable. Protonix paused while zosyn is running. Pt is is very hard stick and only has 1 IV site. Pharmacy called, and they recommend either get a second IV site or change to a different abx. PA maczis notified via phone call of this, and PA states to have IV team attempt a second line first and go from there. PA aware that protonix is paused for zosyn admin.

## 2021-02-23 NOTE — Plan of Care (Signed)

## 2021-02-23 NOTE — Progress Notes (Signed)
Subjective/Chief Complaint: Feels much better today, no n/v, no abd pain   Objective: Vital signs in last 24 hours: Temp:  [97.8 F (36.6 C)-98.6 F (37 C)] 98 F (36.7 C) (10/19 0600) Pulse Rate:  [65-108] 72 (10/19 0919) Resp:  [4-20] 18 (10/19 0919) BP: (133-167)/(84-122) 134/94 (10/19 0919) SpO2:  [92 %-100 %] 99 % (10/19 0919) Weight:  [99.8 kg] 99.8 kg (10/18 1257) Last BM Date: 02/19/21  Intake/Output from previous day: 10/18 0701 - 10/19 0700 In: 1883.6 [I.V.:792.8; IV Piggyback:1090.8] Out: 900 [Emesis/NG output:100; Stool:800] Intake/Output this shift: No intake/output data recorded.  General appearance: no distress GI: soft nontender nondistended  Lab Results:  Recent Labs    02/22/21 1509 02/23/21 0556  WBC 17.0* 14.1*  HGB 15.7 15.0  HCT 46.2 46.7  PLT 451* 341   BMET Recent Labs    02/22/21 1509 02/23/21 0556  NA 137 134*  K 3.0* 3.7  CL 90* 96*  CO2 36* 28  GLUCOSE 116* 102*  BUN 16 18  CREATININE 0.86 1.20  CALCIUM 9.1 8.8*   PT/INR No results for input(s): LABPROT, INR in the last 72 hours. ABG No results for input(s): PHART, HCO3 in the last 72 hours.  Invalid input(s): PCO2, PO2  Studies/Results: CT ABDOMEN PELVIS WO CONTRAST  Result Date: 02/22/2021 CLINICAL DATA:  Right upper quadrant pain leukocytosis. EXAM: CT ABDOMEN AND PELVIS WITHOUT CONTRAST TECHNIQUE: Multidetector CT imaging of the abdomen and pelvis was performed following the standard protocol without IV contrast. COMPARISON:  Same day abdominal ultrasound. FINDINGS: Lower chest: No acute abnormality. Hepatobiliary: Diffuse hepatic steatosis. Gallbladder is distended without wall thickening or pericholecystic fluid. No biliary ductal dilation. Pancreas: Inflammatory stranding along the pancreas which is favored reactive related to the perforated peptic ulcer. No pancreatic ductal dilation. Spleen: Within normal limits. Adrenals/Urinary Tract: Bilateral adrenal glands  are unremarkable. No hydronephrosis. No renal, ureteral or bladder calculus visualized. Urinary bladder is unremarkable for degree of distension. Stomach/Bowel: There is a defect in the gastric antrum with contained gas and fluid in the lesser sac of the stomach pelvis well as adjacent inflammatory stranding which extends into the retroperitoneum and along the mesenteric root. Vascular/Lymphatic: No abdominal aortic aneurysm. No pathologically enlarged abdominal or pelvic lymph nodes. Reproductive: Prostate is unremarkable. Other: No abdominal wall hernia. Musculoskeletal: No acute or significant osseous findings. IMPRESSION: 1. Perforated peptic ulcer with contained gas and fluid in the lesser sac of the stomach and adjacent inflammatory stranding which extends into the retroperitoneum and along the mesenteric root. 2. Inflammatory stranding along the pancreas is favored reactive related to the perforated peptic ulcer. 3. Diffuse hepatic steatosis. These results were called by telephone at the time of interpretation on 02/22/2021 at 5:03 pm to provider [facility] , who verbally acknowledged these results. Electronically Signed   By: Maudry Mayhew M.D.   On: 02/22/2021 17:05   US Abdomen Limited  Result Date: 02/22/2021 CLINICAL DATA:  RUQ pain, vomiting, eval biliary disease EXAM: ULTRASOUND ABDOMEN LIMITED RIGHT UPPER QUADRANT COMPARISON:  None. FINDINGS: Gallbladder: No gallstones or wall thickening visualized. Gallbladder is distended without pericholecystic fluid. No sonographic Murphy sign noted by sonographer. Common bile duct: Diameter: 5 mm, within normal limits Liver: No focal lesion identified. Diffusely increased hepatic parenchymal echogenicity. Portal vein is patent on color Doppler imaging with normal direction of blood flow towards the liver. Other: None. IMPRESSION: 1. Hepatic steatosis. 2. No sonographic evidence of acute cholecystitis. Gallbladder is distended which can be seen in NPO  state. Electronically Signed   By: Meda Klinefelter M.D.   On: 02/22/2021 14:44   DG Abd Portable 1V  Result Date: 02/22/2021 CLINICAL DATA:  Check gastric catheter placement EXAM: PORTABLE ABDOMEN - 1 VIEW COMPARISON:  None. FINDINGS: Gastric catheter is noted with the tip in the stomach. The proximal side port lies at the gastroesophageal junction. This should be advanced deeper into the stomach. IMPRESSION: Gastric catheter as described. This could be advanced deeper into the stomach. Electronically Signed   By: Alcide Clever M.D.   On: 02/22/2021 21:31    Anti-infectives: Anti-infectives (From admission, onward)    Start     Dose/Rate Route Frequency Ordered Stop   02/22/21 2359  piperacillin-tazobactam (ZOSYN) IVPB 3.375 g        3.375 g 12.5 mL/hr over 240 Minutes Intravenous Every 8 hours 02/22/21 2123     02/22/21 1715  piperacillin-tazobactam (ZOSYN) IVPB 3.375 g        3.375 g 100 mL/hr over 30 Minutes Intravenous  Once 02/22/21 1705 02/22/21 1947       Assessment/Plan: Perforated likely contained gastric ulcer -will get ugi today to confirm and if contained continue treat nonoperatively, no clinical indication for surgery right now -continue zosyn and protonix -lovenox and scds  Emelia Loron 02/23/2021

## 2021-02-24 LAB — CBC
HCT: 38.9 % — ABNORMAL LOW (ref 39.0–52.0)
Hemoglobin: 13 g/dL (ref 13.0–17.0)
MCH: 29.5 pg (ref 26.0–34.0)
MCHC: 33.4 g/dL (ref 30.0–36.0)
MCV: 88.2 fL (ref 80.0–100.0)
Platelets: 361 10*3/uL (ref 150–400)
RBC: 4.41 MIL/uL (ref 4.22–5.81)
RDW: 13 % (ref 11.5–15.5)
WBC: 12 10*3/uL — ABNORMAL HIGH (ref 4.0–10.5)
nRBC: 0 % (ref 0.0–0.2)

## 2021-02-24 LAB — H PYLORI, IGM, IGG, IGA AB
H Pylori IgG: 1.93 Index Value — ABNORMAL HIGH (ref 0.00–0.79)
H. Pylogi, Iga Abs: 34.3 units — ABNORMAL HIGH (ref 0.0–8.9)
H. Pylogi, Igm Abs: 11.6 units — ABNORMAL HIGH (ref 0.0–8.9)

## 2021-02-24 MED ORDER — OXYCODONE HCL 5 MG PO TABS: 5.0000 mg | ORAL_TABLET | ORAL | Status: DC | PRN

## 2021-02-24 MED ORDER — PANTOPRAZOLE SODIUM 40 MG PO TBEC: 40.0000 mg | DELAYED_RELEASE_TABLET | Freq: Two times a day (BID) | ORAL | 1 refills | Status: DC

## 2021-02-24 MED ORDER — OXYCODONE HCL 5 MG PO TABS: 5.0000 mg | ORAL_TABLET | Freq: Four times a day (QID) | ORAL | 0 refills | Status: DC | PRN

## 2021-02-24 MED ORDER — ACETAMINOPHEN 500 MG PO TABS: 1000.0000 mg | ORAL_TABLET | Freq: Four times a day (QID) | ORAL | Status: DC | PRN

## 2021-02-24 MED ORDER — AMOXICILLIN-POT CLAVULANATE 875-125 MG PO TABS: 1.0000 | ORAL_TABLET | Freq: Two times a day (BID) | ORAL | 0 refills | Status: DC

## 2021-02-24 MED ORDER — ACETAMINOPHEN 500 MG PO TABS: 1000.0000 mg | ORAL_TABLET | Freq: Three times a day (TID) | ORAL | 0 refills | Status: DC | PRN

## 2021-02-24 NOTE — Discharge Summary (Signed)
Patient ID: Jack Bush [id number] Jan 29, 1986 35 y.o.  Admit date: 02/22/2021 Discharge date: 02/24/2021  Admitting Diagnosis: Perforated gastric ulcer  Discharge Diagnosis Perforated, contained gastric ulcer  Consultants None  H&P: This is a 35 year old gentleman who presents with a 10-day history of right upper quadrant abdominal pain, nausea, and vomiting.  He reports that this started suddenly over a week ago Saturday.  He describes the pain as sharp and severe but intermittent.  He has had vomiting every day.  He has used 2 large bottles of Pepto-Bismol over the past week.  He denies use of NSAIDs or other anti-inflammatories.  He has no previous history of ulcer disease.  He does smoke and has a previous history of drug use.  He presented to the emergency department earlier today.  He first had an ultrasound which ruled out gallstones.  He then had a CAT scan of his abdomen and pelvis without contrast showing a perforated gastric ulcer into the lesser sac with some air and fluid and inflammatory changes but no free air or free fluid.  He has been having bowel movements.  He reports that his pain is no worse than it was 10 days ago.  He denies fevers or chills.  Procedures None  Hospital Course:  Patient presented as above after 10 days of pain and was found to have a perforated peptic ulcer with contained gas and fluid in the lesser sac of the stomach and adjacent inflammatory stranding which extends into the retroperitoneum and along the mesenteric root on CT. This appear contained. Decision was made to observe with nonoperative management given patient was 10 days into symptom onset. NGT was placed and patient was started on IV abx and PPI. He had a UGI on 10/19 that showed a contained perforated ulcer involving the gastric antrum. There was no free extravasation of contrast into the peritoneal cavity. NGT was discontinued and diet was advanced and tolerated. On 10/20, the  patient was tolerating diet without abdominal pain or n/v, was NT on exam, WBC downtrending, vital signs stable and felt stable for discharge home. He was discharged on abx and PPI. He is to follow up in the office as noted below. We will refer to GI for upper endoscopy can be performed in 6 to 8 weeks after his follow up appointment. His H. Pylori was pending at the time of discharge  Physical Exam: At discharge Gen: Awake and alert, NAD Lungs: normal rate and effort Abd: Soft, ND, NT  Allergies as of 02/24/2021   No Known Allergies      Medication List     TAKE these medications    acetaminophen 500 MG tablet Commonly known as: TYLENOL Take 2 tablets (1,000 mg total) by mouth every 8 (eight) hours as needed for mild pain.   amoxicillin-clavulanate 875-125 MG tablet Commonly known as: AUGMENTIN Take 1 tablet by mouth every 12 (twelve) hours.   oxyCODONE 5 MG immediate release tablet Commonly known as: Oxy IR/ROXICODONE Take 1 tablet (5 mg total) by mouth every 6 (six) hours as needed.   pantoprazole 40 MG tablet Commonly known as: Protonix Take 1 tablet (40 mg total) by mouth 2 (two) times daily.          Follow-up Information     Emelia Loron, MD Follow up on 03/11/2021.   Specialty: General Surgery Why: 1140am. Please arrive 30 minutes prior to your appointment for paperwork. Please bring a copy of your photo ID and insurance card.  Contact information: [address] [place] [address] [phone]                 Signed: Leary Roca, [facility] 02/24/2021, 2:48 PM Please see Amion for pager number during day hours 7:00am-4:30pm

## 2021-02-24 NOTE — Progress Notes (Signed)
Subjective: CC: Reports yesterday evening he started having some "achiness" in his epigastrium after getting up and walking. This resolved and he has no pain this am. Tolerating cld without abdominal pain following or associated n/v. He is passing flatus and had a bm this am.   Objective: Vital signs in last 24 hours: Temp:  [97.9 F (36.6 C)-99.7 F (37.6 C)] 98.8 F (37.1 C) (10/20 0532) Pulse Rate:  [72-99] 76 (10/20 0532) Resp:  [16-18] 16 (10/20 0532) BP: (129-141)/(78-94) 137/78 (10/20 0532) SpO2:  [97 %-100 %] 97 % (10/20 0532) Last BM Date: 02/24/21  Intake/Output from previous day: 10/19 0701 - 10/20 0700 In: 2651 [P.O.:360; I.V.:2160.5; IV Piggyback:130.5] Out: 1300 [Urine:1300] Intake/Output this shift: No intake/output data recorded.  PE: Gen:  Alert, NAD, pleasant HEENT: EOM's intact, pupils equal and round Card:  RRR Pulm:  CTAB, no W/R/R, effort normal Abd: Soft, ND, NT +BS Psych: A&Ox3  Skin: no rashes noted, warm and dry  Lab Results:  Recent Labs    02/23/21 0556 02/24/21 0408  WBC 14.1* 12.0*  HGB 15.0 13.0  HCT 46.7 38.9*  PLT 341 361   BMET Recent Labs    02/22/21 1509 02/23/21 0556  NA 137 134*  K 3.0* 3.7  CL 90* 96*  CO2 36* 28  GLUCOSE 116* 102*  BUN 16 18  CREATININE 0.86 1.20  CALCIUM 9.1 8.8*   PT/INR No results for input(s): LABPROT, INR in the last 72 hours. CMP     Component Value Date/Time   NA 134 (L) 02/23/2021 0556   K 3.7 02/23/2021 0556   CL 96 (L) 02/23/2021 0556   CO2 28 02/23/2021 0556   GLUCOSE 102 (H) 02/23/2021 0556   BUN 18 02/23/2021 0556   CREATININE 1.20 02/23/2021 0556   CALCIUM 8.8 (L) 02/23/2021 0556   PROT 8.4 (H) 02/22/2021 1509   ALBUMIN 4.0 02/22/2021 1509   AST 28 02/22/2021 1509   ALT 50 (H) 02/22/2021 1509   ALKPHOS 81 02/22/2021 1509   BILITOT 1.0 02/22/2021 1509   GFRNONAA >60 02/23/2021 0556   GFRAA >90 07/04/2012 0059   Lipase     Component Value Date/Time   LIPASE  31 02/22/2021 1509    Studies/Results: CT ABDOMEN PELVIS WO CONTRAST  Result Date: 02/22/2021 CLINICAL DATA:  Right upper quadrant pain leukocytosis. EXAM: CT ABDOMEN AND PELVIS WITHOUT CONTRAST TECHNIQUE: Multidetector CT imaging of the abdomen and pelvis was performed following the standard protocol without IV contrast. COMPARISON:  Same day abdominal ultrasound. FINDINGS: Lower chest: No acute abnormality. Hepatobiliary: Diffuse hepatic steatosis. Gallbladder is distended without wall thickening or pericholecystic fluid. No biliary ductal dilation. Pancreas: Inflammatory stranding along the pancreas which is favored reactive related to the perforated peptic ulcer. No pancreatic ductal dilation. Spleen: Within normal limits. Adrenals/Urinary Tract: Bilateral adrenal glands are unremarkable. No hydronephrosis. No renal, ureteral or bladder calculus visualized. Urinary bladder is unremarkable for degree of distension. Stomach/Bowel: There is a defect in the gastric antrum with contained gas and fluid in the lesser sac of the stomach pelvis well as adjacent inflammatory stranding which extends into the retroperitoneum and along the mesenteric root. Vascular/Lymphatic: No abdominal aortic aneurysm. No pathologically enlarged abdominal or pelvic lymph nodes. Reproductive: Prostate is unremarkable. Other: No abdominal wall hernia. Musculoskeletal: No acute or significant osseous findings. IMPRESSION: 1. Perforated peptic ulcer with contained gas and fluid in the lesser sac of the stomach and adjacent inflammatory stranding which extends into the retroperitoneum  and along the mesenteric root. 2. Inflammatory stranding along the pancreas is favored reactive related to the perforated peptic ulcer. 3. Diffuse hepatic steatosis. These results were called by telephone at the time of interpretation on 02/22/2021 at 5:03 pm to provider [facility] , who verbally acknowledged these results. Electronically Signed   By:  Maudry Mayhew M.D.   On: 02/22/2021 17:05   US Abdomen Limited  Result Date: 02/22/2021 CLINICAL DATA:  RUQ pain, vomiting, eval biliary disease EXAM: ULTRASOUND ABDOMEN LIMITED RIGHT UPPER QUADRANT COMPARISON:  None. FINDINGS: Gallbladder: No gallstones or wall thickening visualized. Gallbladder is distended without pericholecystic fluid. No sonographic Murphy sign noted by sonographer. Common bile duct: Diameter: 5 mm, within normal limits Liver: No focal lesion identified. Diffusely increased hepatic parenchymal echogenicity. Portal vein is patent on color Doppler imaging with normal direction of blood flow towards the liver. Other: None. IMPRESSION: 1. Hepatic steatosis. 2. No sonographic evidence of acute cholecystitis. Gallbladder is distended which can be seen in NPO state. Electronically Signed   By: Meda Klinefelter M.D.   On: 02/22/2021 14:44   DG Abd Portable 1V  Result Date: 02/22/2021 CLINICAL DATA:  Check gastric catheter placement EXAM: PORTABLE ABDOMEN - 1 VIEW COMPARISON:  None. FINDINGS: Gastric catheter is noted with the tip in the stomach. The proximal side port lies at the gastroesophageal junction. This should be advanced deeper into the stomach. IMPRESSION: Gastric catheter as described. This could be advanced deeper into the stomach. Electronically Signed   By: Alcide Clever M.D.   On: 02/22/2021 21:31   DG UGI W SINGLE CM (SOL OR THIN BA)  Result Date: 02/23/2021 CLINICAL DATA:  Perforated gastric ulcer. Undergoing conservative therapy with nasogastric tube placement. EXAM: UPPER GI SERIES WITH KUB TECHNIQUE: After obtaining a scout radiograph, water-soluble contrast (100 cc Omnipaque 300) was instilled via the pre-existing nasogastric tube under fluoroscopic observation. This was followed by injection of 40 cc of air. FLUOROSCOPY TIME:  Fluoroscopy Time: 6 seconds of low-dose pulsed fluoroscopy Radiation Exposure Index (if provided by the fluoroscopic device): 45.6 mGy  Number of Acquired Spot Images: 4 COMPARISON:  Abdominopelvic CT and radiographs 02/22/2021. FINDINGS: The nasogastric tube tip is positioned in the distal stomach, near the previously demonstrated ulcer. On injection of contrast, there is rapid filling of the previously demonstrated contained perforation involving the superior aspect of the gastric antrum. This collection measures up to 3.0 cm in greatest dimension and appears similar to the recent CT. There is no contrast extravasation into the peritoneal cavity. There is emptying of contrast into the duodenum which appears normal. No other focal abnormalities of the stomach or small bowel are identified. The esophagus was not examined on this examination performed via a nasogastric tube. IMPRESSION: 1. Unchanged appearance of contained perforated ulcer involving the gastric antrum. No free extravasation of contrast into the peritoneal cavity or gastric outlet obstruction identified. 2. The enteric tube tip is near the ulcer. 3. Follow-up endoscopy or CT recommended to confirm the benign nature of this ulcer given irregular gastric wall thickening and surrounding lymph nodes on recent CT. Electronically Signed   By: Carey Bullocks M.D.   On: 02/23/2021 12:45    Anti-infectives: Anti-infectives (From admission, onward)    Start     Dose/Rate Route Frequency Ordered Stop   02/22/21 2359  piperacillin-tazobactam (ZOSYN) IVPB 3.375 g        3.375 g 12.5 mL/hr over 240 Minutes Intravenous Every 8 hours 02/22/21 2123  02/22/21 1715  piperacillin-tazobactam (ZOSYN) IVPB 3.375 g        3.375 g 100 mL/hr over 30 Minutes Intravenous  Once 02/22/21 1705 02/22/21 1947        Assessment/Plan Perforated, contained gastric ulcer - CT w/ Perforated peptic ulcer with contained gas and fluid in the lesser sac of the stomach and adjacent inflammatory stranding which extends into the retroperitoneum and along the mesenteric root. - Ugi 10/19 contained  perforated ulcer involving the gastric antrum. No free extravasation of contrast into the peritoneal cavity or gastric outlet obstruction identified - No indication for surgery at this time - Cont IV abx while inpatient - He NT on exam and denies any pain. Tolerating cld. WBC down.  - Adv to FLD. Possible d/c later today on oral abx  - H. Pylori pending - Cont PPI - Transition to PO pain medications - Mobilize, pulm toilet  FEN - FLD VTE - SCDs, Lovenox ID - Zosyn    LOS: 2 days    Jacinto Halim , [facility] 02/24/2021, 8:27 AM Please see Amion for pager number during day hours 7:00am-4:30pm

## 2021-02-24 NOTE — Progress Notes (Signed)
Discharge instructions given to patient and all questions were answered.  

## 2021-02-24 NOTE — Discharge Instructions (Addendum)
Please take your antibiotic to completion Please make sure to take your Protonix as prescribed Please do not take NSAIDS (Non Steriodal Anti Inflammatory Medications such as aspirin, ibuprofen, advil, aleve, mobic, naprosyn, naproxen etc) Please do not drive or operate heavy machinery while taking narcotic pain medication (Oxycodone) if you were prescribed them Please call our office or go to the emergency department if you develop fever, vomiting, new/worsening abdominal pain or any other concerns

## 2022-07-28 IMAGING — RF DG UGI W SINGLE CM
10 of 15 series · 15 of 24 positions shown · IV contrast (omnipaque)
Comparison: Abdominopelvic CT and radiographs 02/22/2021.

CLINICAL DATA: Perforated gastric ulcer. Undergoing conservative
therapy with nasogastric tube placement.

EXAM:
UPPER GI SERIES WITH KUB
TECHNIQUE: After obtaining a scout radiograph, water-soluble contrast (100 cc
Omnipaque 300) was instilled via the pre-existing nasogastric tube
under fluoroscopic observation. This was followed by injection of 40
cc of air.
FLUOROSCOPY TIME:  Fluoroscopy Time: 6 seconds of low-dose pulsed
fluoroscopy
Radiation Exposure Index (if provided by the fluoroscopic device):
45.6 mGy
Number of Acquired Spot Images: 4

[Series 1: t abdomen supine · 0.15mm/px · 1 of 1 slices shown]
[im 1/1]
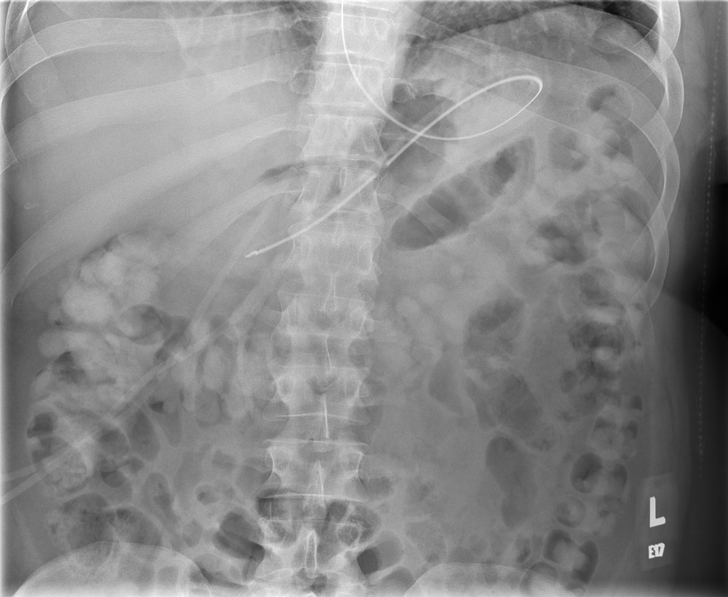

[Series 4: cp_standard · 0.54mm/px · 2 of 27 frames shown (1 of 6)]
[frame 5/27]
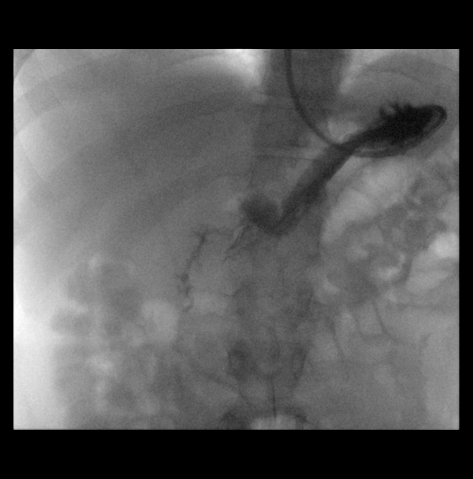
[frame 18/27]
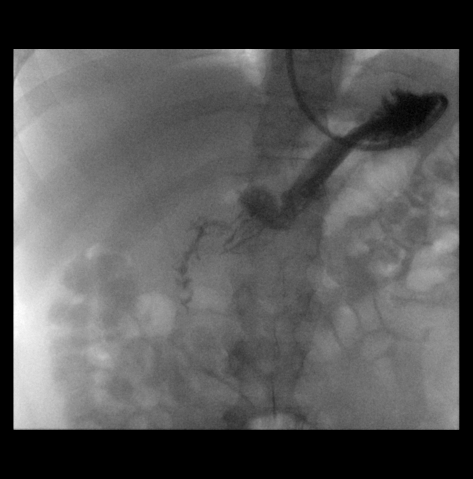

[Series 5: cp_standard · 0.54mm/px · 3 of 22 frames shown (2 of 6)]
[frame 3/22]
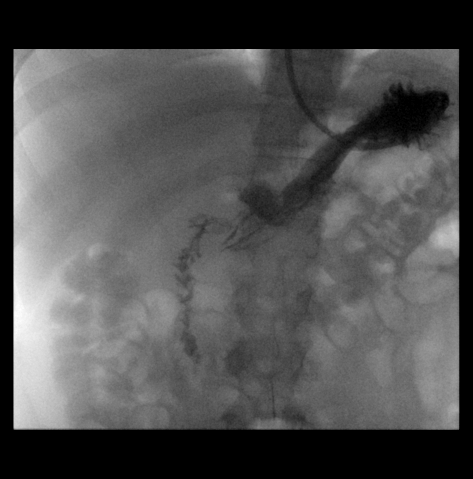
[frame 12/22]
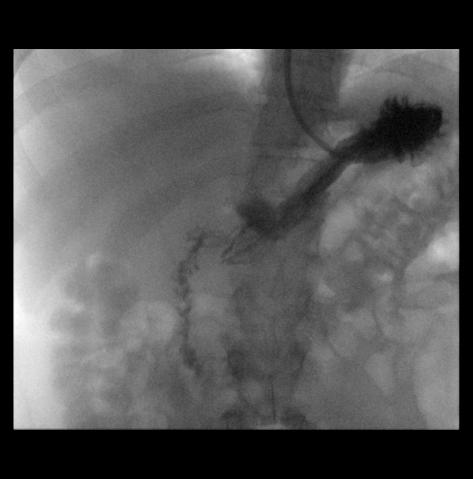
[frame 19/22]
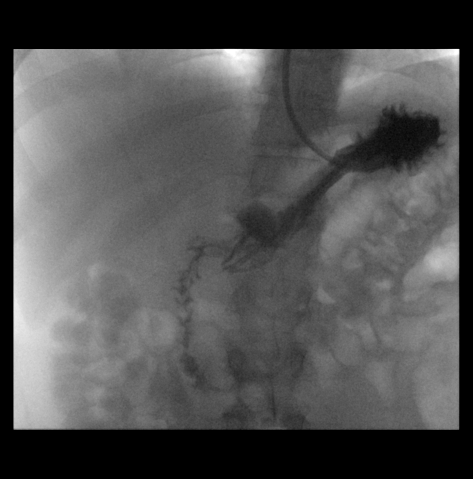

[Series 8: cp_standard · 0.27mm/px · 1 of 1 slices shown (3 of 6)]
[im 1/1]
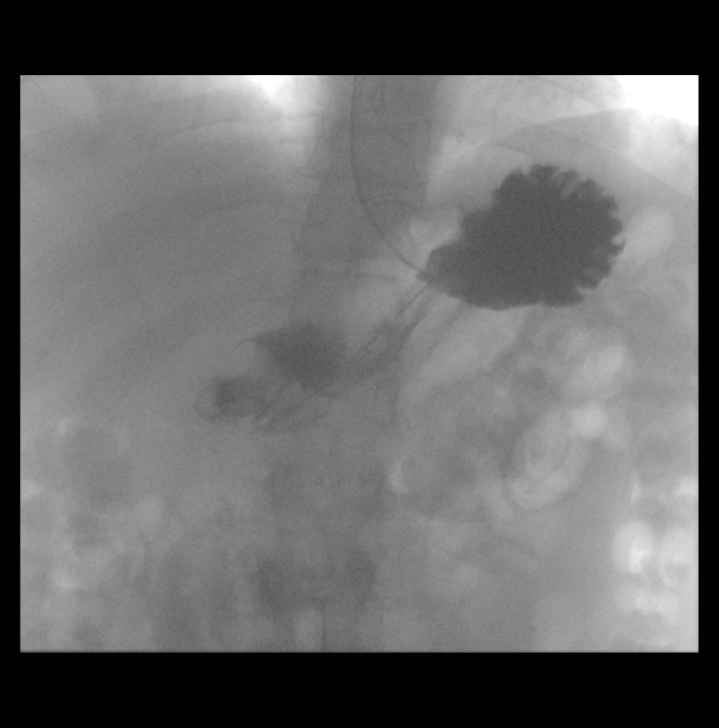

[Series 11: cp_standard · 0.27mm/px · 1 of 1 slices shown (4 of 6)]
[im 1/1]
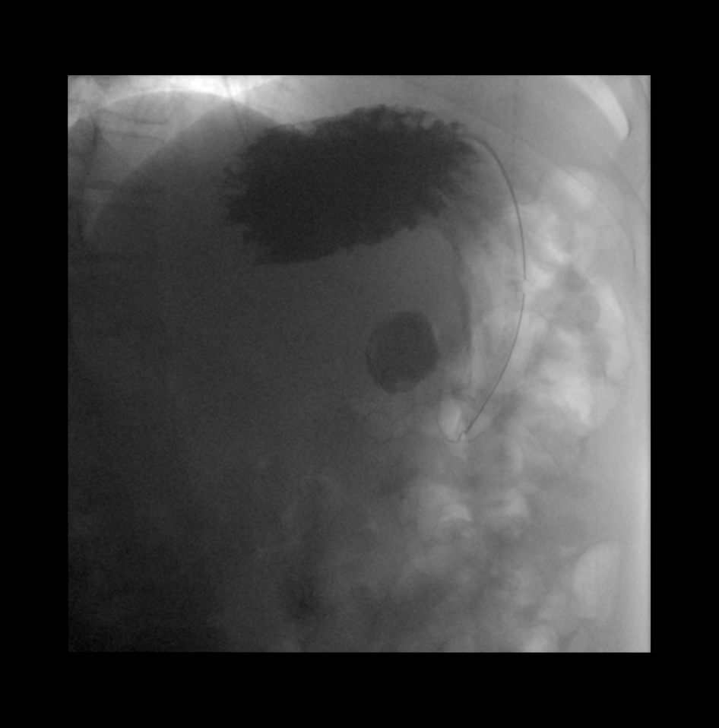

[Series 12: fluoro_barium singleshot_bw · 0.18mm/px · 1 of 1 slices shown (1 of 3)]
[im 1/1]
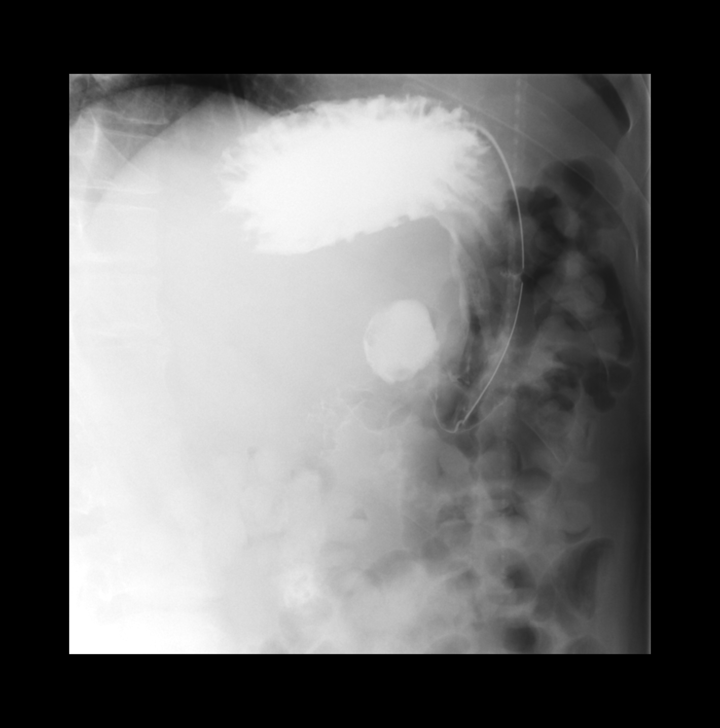

[Series 14: cp_standard · 0.56mm/px · 2 of 52 frames shown (5 of 6)]
[frame 27/52]
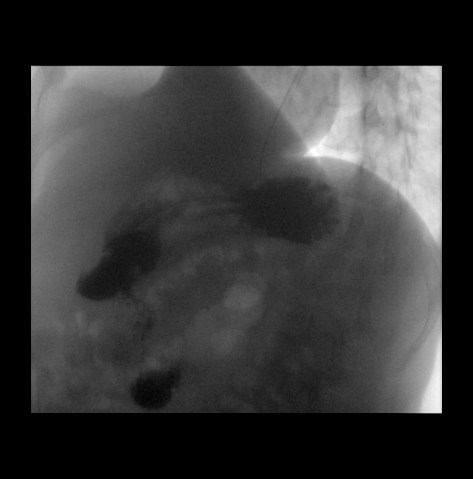
[frame 41/52]
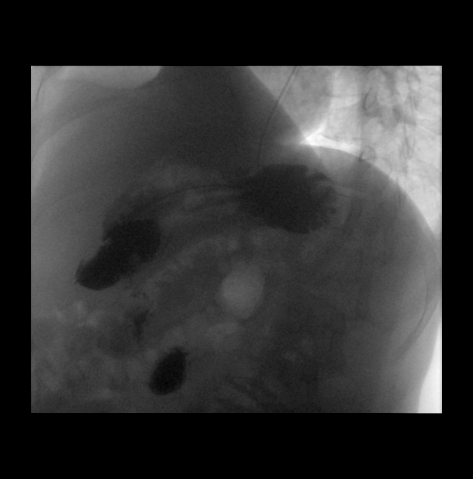

[Series 15: fluoro_barium singleshot_bw · 0.19mm/px · 1 of 1 slices shown (2 of 3)]
[im 1/1]
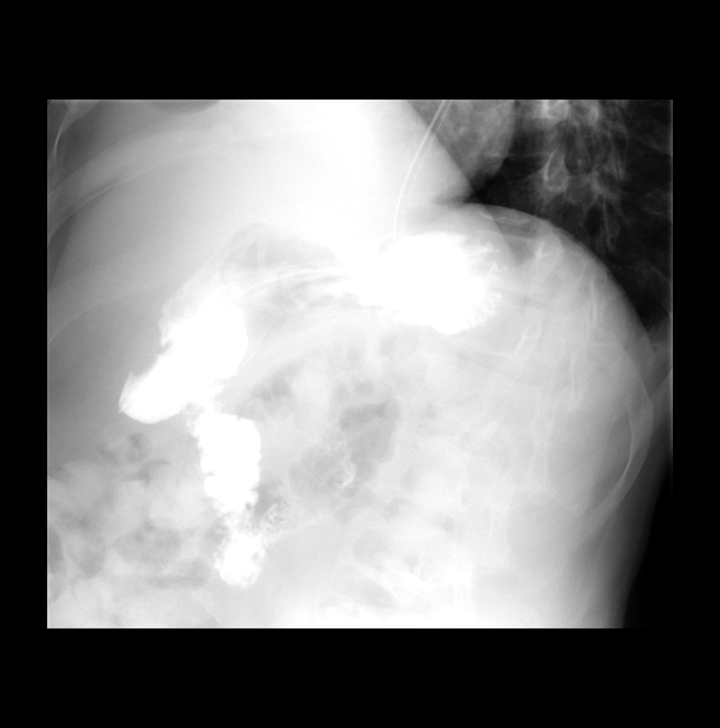

[Series 18: cp_standard · 0.57mm/px · 2 of 45 frames shown (6 of 6)]
[frame 5/45]
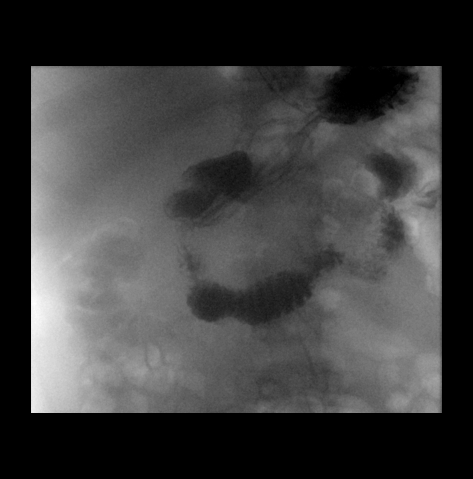
[frame 7/45]
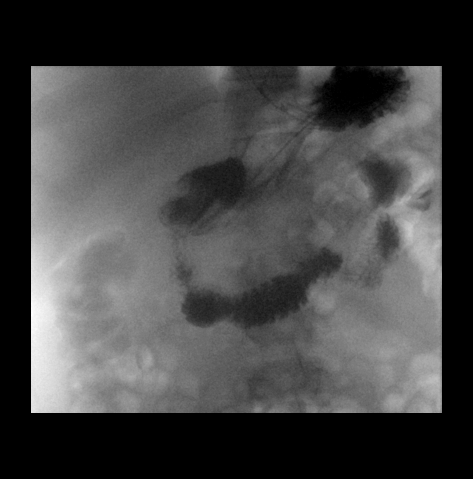

[Series 19: fluoro_barium singleshot_bw · 0.19mm/px · 1 of 1 slices shown (3 of 3)]
[im 1/1]
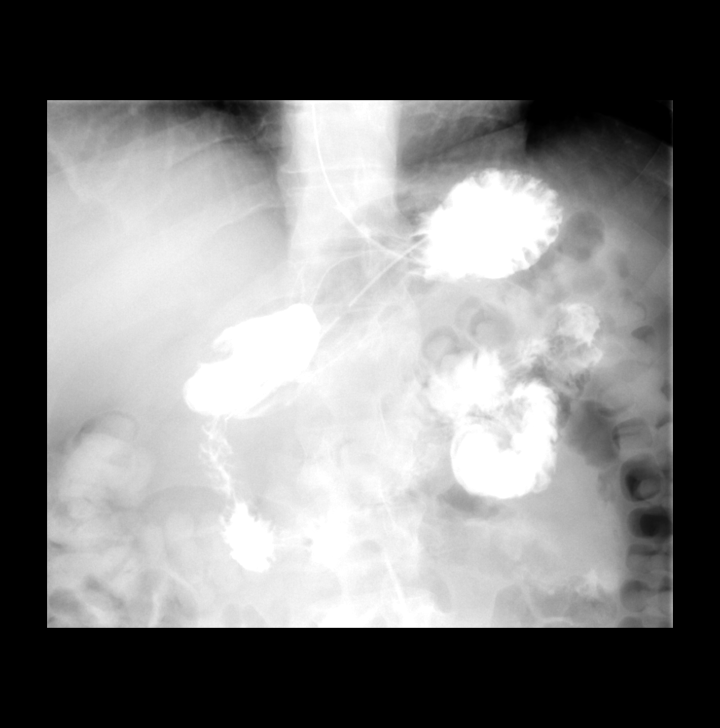

[15 of 24 positions shown; findings below may reference images not displayed]

FINDINGS: The nasogastric tube tip is positioned in the distal stomach, near
the previously demonstrated ulcer. On injection of contrast, there
is rapid filling of the previously demonstrated contained
perforation involving the superior aspect of the gastric antrum.
This collection measures up to 3.0 cm in greatest dimension and
appears similar to the recent CT. There is no contrast extravasation
into the peritoneal cavity.

There is emptying of contrast into the duodenum which appears
normal. No other focal abnormalities of the stomach or small bowel
are identified. The esophagus was not examined on this examination
performed via a nasogastric tube.
IMPRESSION: 1. Unchanged appearance of contained perforated ulcer involving the
gastric antrum. No free extravasation of contrast into the
peritoneal cavity or gastric outlet obstruction identified.
2. The enteric tube tip is near the ulcer.
3. Follow-up endoscopy or CT recommended to confirm the benign
nature of this ulcer given irregular gastric wall thickening and
surrounding lymph nodes on recent CT.

## 2023-04-13 ENCOUNTER — Observation Stay (HOSPITAL_COMMUNITY): Payer: Self-pay | Admitting: Certified Registered Nurse Anesthetist

## 2023-04-13 ENCOUNTER — Observation Stay (HOSPITAL_COMMUNITY)
Admission: EM | Admit: 2023-04-13 | Discharge: 2023-04-14 | Discharge status: Against Medical Advice | Payer: Self-pay | Attending: Family Medicine | Admitting: Family Medicine

## 2023-04-13 ENCOUNTER — Emergency Department (HOSPITAL_COMMUNITY): Payer: Self-pay

## 2023-04-13 ENCOUNTER — Encounter (HOSPITAL_COMMUNITY): Payer: Self-pay

## 2023-04-13 ENCOUNTER — Other Ambulatory Visit: Payer: Self-pay

## 2023-04-13 DIAGNOSIS — J9601 Acute respiratory failure with hypoxia: Secondary | ICD-10-CM | POA: Insufficient documentation

## 2023-04-13 DIAGNOSIS — F17210 Nicotine dependence, cigarettes, uncomplicated: Secondary | ICD-10-CM | POA: Insufficient documentation

## 2023-04-13 DIAGNOSIS — E871 Hypo-osmolality and hyponatremia: Secondary | ICD-10-CM | POA: Insufficient documentation

## 2023-04-13 DIAGNOSIS — R10811 Right upper quadrant abdominal tenderness: Principal | ICD-10-CM | POA: Insufficient documentation

## 2023-04-13 DIAGNOSIS — B9689 Other specified bacterial agents as the cause of diseases classified elsewhere: Secondary | ICD-10-CM | POA: Diagnosis present

## 2023-04-13 DIAGNOSIS — K2900 Acute gastritis without bleeding: Principal | ICD-10-CM

## 2023-04-13 DIAGNOSIS — K251 Acute gastric ulcer with perforation: Secondary | ICD-10-CM | POA: Insufficient documentation

## 2023-04-13 DIAGNOSIS — K269 Duodenal ulcer, unspecified as acute or chronic, without hemorrhage or perforation: Secondary | ICD-10-CM

## 2023-04-13 LAB — URINALYSIS, ROUTINE W REFLEX MICROSCOPIC
Bilirubin Urine: NEGATIVE
Glucose, UA: NEGATIVE mg/dL
Hgb urine dipstick: NEGATIVE
Ketones, ur: NEGATIVE mg/dL
Leukocytes,Ua: NEGATIVE
Nitrite: NEGATIVE
Protein, ur: NEGATIVE mg/dL
Specific Gravity, Urine: >1.046 — ABNORMAL HIGH (ref 1.005–1.030)
pH: 8 (ref 5.0–8.0)

## 2023-04-13 LAB — CBC
HCT: 48.6 % (ref 39.0–52.0)
Hemoglobin: 16.5 g/dL (ref 13.0–17.0)
MCH: 31 pg (ref 26.0–34.0)
MCHC: 34 g/dL (ref 30.0–36.0)
MCV: 91.4 fL (ref 80.0–100.0)
Platelets: 324 10*3/uL (ref 150–400)
RBC: 5.32 MIL/uL (ref 4.22–5.81)
RDW: 13 % (ref 11.5–15.5)
WBC: 11.2 10*3/uL — ABNORMAL HIGH (ref 4.0–10.5)
nRBC: 0 % (ref 0.0–0.2)

## 2023-04-13 LAB — COMPREHENSIVE METABOLIC PANEL
ALT: 94 U/L — ABNORMAL HIGH (ref 0–44)
AST: 46 U/L — ABNORMAL HIGH (ref 15–41)
Albumin: 4.3 g/dL (ref 3.5–5.0)
Alkaline Phosphatase: 86 U/L (ref 38–126)
Anion gap: 7 (ref 5–15)
BUN: 10 mg/dL (ref 6–20)
CO2: 27 mmol/L (ref 22–32)
Calcium: 10.1 mg/dL (ref 8.9–10.3)
Chloride: 102 mmol/L (ref 98–111)
Creatinine, Ser: 0.88 mg/dL (ref 0.61–1.24)
GFR, Estimated: >60 mL/min (ref >60)
Glucose, Bld: 138 mg/dL — ABNORMAL HIGH (ref 70–99)
Potassium: 3.4 mmol/L — ABNORMAL LOW (ref 3.5–5.1)
Sodium: 136 mmol/L (ref 135–145)
Total Bilirubin: 1.1 mg/dL (ref <1.2)
Total Protein: 8.3 g/dL — ABNORMAL HIGH (ref 6.5–8.1)

## 2023-04-13 LAB — LIPASE, BLOOD: Lipase: 39 U/L (ref 11–51)

## 2023-04-13 LAB — HIV ANTIBODY (ROUTINE TESTING W REFLEX): HIV Screen 4th Generation wRfx: NONREACTIVE

## 2023-04-13 MED ORDER — PANTOPRAZOLE SODIUM 40 MG IV SOLR
40.0000 mg | Freq: Once | INTRAVENOUS | Status: DC
  Filled 2023-04-13: qty 10

## 2023-04-13 MED ORDER — ONDANSETRON HCL 4 MG PO TABS: 4.0000 mg | ORAL_TABLET | Freq: Four times a day (QID) | ORAL | Status: DC | PRN

## 2023-04-13 MED ORDER — MORPHINE SULFATE (PF) 4 MG/ML IV SOLN
4.0000 mg | Freq: Once | INTRAVENOUS | Status: AC
  Administered 2023-04-13: 4 mg via INTRAVENOUS
  Filled 2023-04-13: qty 1

## 2023-04-13 MED ORDER — MORPHINE SULFATE (PF) 2 MG/ML IV SOLN: 2.0000 mg | INTRAVENOUS | Status: DC | PRN

## 2023-04-13 MED ORDER — FAMOTIDINE IN NACL 20-0.9 MG/50ML-% IV SOLN
20.0000 mg | Freq: Once | INTRAVENOUS | Status: AC
  Administered 2023-04-13: 20 mg via INTRAVENOUS
  Filled 2023-04-13: qty 50

## 2023-04-13 MED ORDER — ACETAMINOPHEN 325 MG PO TABS: 650.0000 mg | ORAL_TABLET | Freq: Four times a day (QID) | ORAL | Status: DC | PRN

## 2023-04-13 MED ORDER — ALUM & MAG HYDROXIDE-SIMETH 200-200-20 MG/5ML PO SUSP
30.0000 mL | Freq: Once | ORAL | Status: AC
  Administered 2023-04-13: 30 mL via ORAL
  Filled 2023-04-13: qty 30

## 2023-04-13 MED ORDER — ONDANSETRON HCL 4 MG/2ML IJ SOLN
4.0000 mg | Freq: Once | INTRAMUSCULAR | Status: AC | PRN
  Administered 2023-04-13: 4 mg via INTRAVENOUS
  Filled 2023-04-13: qty 2

## 2023-04-13 MED ORDER — PANTOPRAZOLE SODIUM 40 MG IV SOLR
40.0000 mg | Freq: Two times a day (BID) | INTRAVENOUS | Status: DC
  Administered 2023-04-13 (×2): 40 mg via INTRAVENOUS
  Filled 2023-04-13: qty 10

## 2023-04-13 MED ORDER — ALBUTEROL SULFATE (2.5 MG/3ML) 0.083% IN NEBU: 2.5000 mg | INHALATION_SOLUTION | RESPIRATORY_TRACT | Status: DC | PRN

## 2023-04-13 MED ORDER — ONDANSETRON HCL 4 MG/2ML IJ SOLN: 4.0000 mg | Freq: Four times a day (QID) | INTRAMUSCULAR | Status: DC | PRN

## 2023-04-13 MED ORDER — SODIUM CHLORIDE 0.9 % IV BOLUS
1000.0000 mL | Freq: Once | INTRAVENOUS | Status: AC
  Administered 2023-04-13: 1000 mL via INTRAVENOUS

## 2023-04-13 MED ORDER — ACETAMINOPHEN 650 MG RE SUPP: 650.0000 mg | Freq: Four times a day (QID) | RECTAL | Status: DC | PRN

## 2023-04-13 MED ORDER — OXYCODONE HCL 5 MG PO TABS: 5.0000 mg | ORAL_TABLET | ORAL | Status: DC | PRN

## 2023-04-13 MED ORDER — IOHEXOL 300 MG/ML  SOLN
100.0000 mL | Freq: Once | INTRAMUSCULAR | Status: AC | PRN
  Administered 2023-04-13: 100 mL via INTRAVENOUS

## 2023-04-13 MED ORDER — ENOXAPARIN SODIUM 40 MG/0.4ML IJ SOSY
40.0000 mg | PREFILLED_SYRINGE | INTRAMUSCULAR | Status: DC
  Administered 2023-04-13: 40 mg via SUBCUTANEOUS
  Filled 2023-04-13: qty 0.4

## 2023-04-13 NOTE — ED Triage Notes (Signed)
Patient reports epigastric pain, nausea, and vomiting x 2 days. States he has previously had an ulcer and this feel similar.

## 2023-04-13 NOTE — ED Notes (Signed)
ED TO INPATIENT HANDOFF REPORT  ED Nurse Name and Phone #: Thamas Jaegers Name/Age/Gender Jack Bush 37 y.o. male Room/Bed: WA06/WA06  Code Status   Code Status: Full Code  Home/SNF/Other Home Patient oriented to: self, place, time, and situation Is this baseline? Yes   Triage Complete: Triage complete  Chief Complaint Duodenal ulcer due to bacteria [K26.9, B96.89]  Triage Note Patient reports epigastric pain, nausea, and vomiting x 2 days. States he has previously had an ulcer and this feel similar.    Allergies No Known Allergies  Level of Care/Admitting Diagnosis ED Disposition     ED Disposition  Admit   Condition  --   Comment  Hospital Area: [facility] [[id number]]  Level of Care: Med-Surg [16]  May place patient in observation at [facility] or [facility] if equivalent level of care is available:: Yes  Covid Evaluation: Asymptomatic - no recent exposure (last 10 days) testing not required  Diagnosis: Duodenal ulcer due to bacteria [[id number]]  Admitting Physician: Maryln Gottron [[id number]]  Attending Physician: Kirby Crigler, MIR M [[id number]]          B Medical/Surgery History Past Medical History:  Diagnosis Date   Knee joint disorder    both knees grind when pt walks per pt   Past Surgical History:  Procedure Laterality Date   HERNIA REPAIR     MANDIBLE FRACTURE SURGERY       A IV Location/Drains/Wounds Patient Lines/Drains/Airways Status     Active Line/Drains/Airways     Name Placement date Placement time Site Days   Peripheral IV 04/13/23 20 G Anterior;Right;Upper Arm 04/13/23  0946  Arm  less than 1            Intake/Output Last 24 hours  Intake/Output Summary (Last 24 hours) at 04/13/2023 1326 Last data filed at 04/13/2023 1203 Gross per 24 hour  Intake 1050 ml  Output --  Net 1050 ml    Labs/Imaging Results for orders placed or performed during the hospital encounter of 04/13/23 (from the past 48 hour(s))   Lipase, blood     Status: None   Collection Time: 04/13/23  9:50 AM  Result Value Ref Range   Lipase 39 11 - 51 U/L    Comment: Performed at [facility], [address] [place], [place] [address]  Comprehensive metabolic panel     Status: Abnormal   Collection Time: 04/13/23  9:50 AM  Result Value Ref Range   Sodium 136 135 - 145 mmol/L   Potassium 3.4 (L) 3.5 - 5.1 mmol/L   Chloride 102 98 - 111 mmol/L   CO2 27 22 - 32 mmol/L   Glucose, Bld 138 (H) 70 - 99 mg/dL    Comment: Glucose reference range applies only to samples taken after fasting for at least 8 hours.   BUN 10 6 - 20 mg/dL   Creatinine, Ser 8.65 0.61 - 1.24 mg/dL   Calcium 78.4 8.9 - 69.6 mg/dL   Total Protein 8.3 (H) 6.5 - 8.1 g/dL   Albumin 4.3 3.5 - 5.0 g/dL   AST 46 (H) 15 - 41 U/L   ALT 94 (H) 0 - 44 U/L   Alkaline Phosphatase 86 38 - 126 U/L   Total Bilirubin 1.1 <1.2 mg/dL   GFR, Estimated >29 >52 mL/min    Comment: (NOTE) Calculated using the CKD-EPI Creatinine Equation (2021)    Anion gap 7 5 - 15    Comment: Performed at [facility], [address] [place], [place] [address]  CBC     Status: Abnormal   Collection Time: 04/13/23  9:50 AM  Result Value Ref Range   WBC 11.2 (H) 4.0 - 10.5 K/uL   RBC 5.32 4.22 - 5.81 MIL/uL   Hemoglobin 16.5 13.0 - 17.0 g/dL   HCT 19.1 47.8 - 29.5 %   MCV 91.4 80.0 - 100.0 fL   MCH 31.0 26.0 - 34.0 pg   MCHC 34.0 30.0 - 36.0 g/dL   RDW 62.1 30.8 - 65.7 %   Platelets 324 150 - 400 K/uL   nRBC 0.0 0.0 - 0.2 %    Comment: Performed at [facility], [address], [place], [place] [address]   CT ABDOMEN PELVIS W CONTRAST  Result Date: 04/13/2023 CLINICAL DATA:  Epigastric pain.  Nausea and vomiting. EXAM: CT ABDOMEN AND PELVIS WITH CONTRAST TECHNIQUE: Multidetector CT imaging of the abdomen and pelvis was performed using the standard protocol following bolus administration of intravenous contrast. RADIATION  DOSE REDUCTION: This exam was performed according to the departmental dose-optimization program which includes automated exposure control, adjustment of the mA and/or kV according to patient size and/or use of iterative reconstruction technique. CONTRAST:  OMNIPAQUE IOHEXOL 300 MG/ML  SOLN COMPARISON:  CT scan abdomen and pelvis from 02/22/2021. FINDINGS: Lower chest: The lung bases are clear. No pleural effusion. The heart is normal in size. No pericardial effusion. Hepatobiliary: The liver is normal in size. Non-cirrhotic configuration. No suspicious mass. No intrahepatic or extrahepatic bile duct dilation. No calcified gallstones. Normal gallbladder wall thickness. No pericholecystic inflammatory changes. Pancreas: Unremarkable. No pancreatic ductal dilatation or surrounding inflammatory changes. Spleen: Within normal limits. No focal lesion. Adrenals/Urinary Tract: Adrenal glands are unremarkable. No suspicious renal mass. No hydronephrosis. No renal or ureteric calculi. Unremarkable urinary bladder. Stomach/Bowel: There is subtle fat stranding surrounding the pylorus/duodenal bulb region. There is mild asymmetric mucosal hyperattenuation in this area. There is an approximately 10 x 13 mm outpouching in this region which is concerning for a small ulcer. Clinical correlation and correlation with upper GI endoscopy is recommended. There is no frank perforation. No disproportionate dilation of the small or large bowel loops. No evidence of abnormal bowel wall thickening or inflammatory changes. The appendix is unremarkable. Vascular/Lymphatic: No ascites or pneumoperitoneum. No abdominal or pelvic lymphadenopathy, by size criteria. No aneurysmal dilation of the major abdominal arteries. Reproductive: Normal size prostate. Symmetric seminal vesicles. Other: There is a tiny fat containing umbilical hernia. The soft tissues and abdominal wall are otherwise unremarkable. Musculoskeletal: No suspicious osseous  lesions. IMPRESSION: *Findings favoring inflammation surrounding the pylorus/duodenal bulb region - peptic gastroduodenitis. There is no frank perforation. However, there is a small outpouching in this region, concerning for a small ulcer. *Multiple other nonacute observations, as described above. Electronically Signed   By: Jules Schick M.D.   On: 04/13/2023 11:47   DG Chest Portable 1 View  Result Date: 04/13/2023 CLINICAL DATA:  Two day history of epigastric pain, nausea, and vomiting EXAM: PORTABLE CHEST 1 VIEW COMPARISON:  Chest radiograph dated 07/04/2012 FINDINGS: Normal lung volumes. No focal consolidations. No pleural effusion or pneumothorax. The heart size and mediastinal contours are within normal limits. No acute osseous abnormality. No radiographic finding of free air in the partially imaged upper abdomen. IMPRESSION: 1. No active disease. 2. No radiographic finding of free air in the partially imaged upper abdomen. Electronically Signed   By: Milus Height.D.  On: 04/13/2023 10:43    Pending Labs Unresulted Labs (From admission, onward)     Start     Ordered   04/14/23 0500  Basic metabolic panel  Tomorrow morning,   R        04/13/23 1313   04/14/23 0500  CBC  Tomorrow morning,   R        04/13/23 1313   04/13/23 1313  HIV Antibody (routine testing w rflx)  (HIV Antibody (Routine testing w reflex) panel)  Once,   R        04/13/23 1313   04/13/23 0918  Urinalysis, Routine w reflex microscopic -Urine, Clean Catch  Once,   URGENT       Question:  Specimen Source  Answer:  Urine, Clean Catch   04/13/23 0917            Vitals/Pain Today's Vitals   04/13/23 1057 04/13/23 1120 04/13/23 1210 04/13/23 1230  BP:  (!) 148/96 (!) 139/97 138/81  Pulse:  78 78 73  Resp:  18 17 17   Temp:      TempSrc:      SpO2:  97% 98% 99%  Weight:      Height:      PainSc: 6        Isolation Precautions No active isolations  Medications Medications  morphine (PF) 4 MG/ML injection 4  mg (has no administration in time range)  enoxaparin (LOVENOX) injection 40 mg (has no administration in time range)  acetaminophen (TYLENOL) tablet 650 mg (has no administration in time range)    Or  acetaminophen (TYLENOL) suppository 650 mg (has no administration in time range)  morphine (PF) 2 MG/ML injection 2 mg (has no administration in time range)  oxyCODONE (Oxy IR/ROXICODONE) immediate release tablet 5 mg (has no administration in time range)  ondansetron (ZOFRAN) tablet 4 mg (has no administration in time range)    Or  ondansetron (ZOFRAN) injection 4 mg (has no administration in time range)  albuterol (PROVENTIL) (2.5 MG/3ML) 0.083% nebulizer solution 2.5 mg (has no administration in time range)  pantoprazole (PROTONIX) injection 40 mg (has no administration in time range)  ondansetron (ZOFRAN) injection 4 mg (4 mg Intravenous Given 04/13/23 0955)  sodium chloride 0.9 % bolus 1,000 mL (0 mLs Intravenous Stopped 04/13/23 1117)  morphine (PF) 4 MG/ML injection 4 mg (4 mg Intravenous Given 04/13/23 0956)  iohexol (OMNIPAQUE) 300 MG/ML solution 100 mL (100 mLs Intravenous Contrast Given 04/13/23 1047)  famotidine (PEPCID) IVPB 20 mg premix (0 mg Intravenous Stopped 04/13/23 1203)  alum & mag hydroxide-simeth (MAALOX/MYLANTA) 200-200-20 MG/5ML suspension 30 mL (30 mLs Oral Given 04/13/23 1206)    Mobility walks     Focused Assessments     R Recommendations: See Admitting Provider Note  Report given to:   Additional Notes:

## 2023-04-13 NOTE — ED Provider Notes (Signed)
[place] EMERGENCY DEPARTMENT AT [facility] Provider Note  CSN: [id number] Arrival date & time: 04/13/23 1191  Chief Complaint(s) Abdominal Pain  HPI Jack Bush is a 37 y.o. male with PMH PAD status post perforated gastric ulcer managed nonoperatively who presents emergency ferment for evaluation of abdominal pain nausea and vomiting.  States that symptoms began 48 hours ago and is worse in the epigastrium and right upper quadrant.  States that symptoms feel similar to his previous perforated gastric ulcer.  No dark stools or hematemesis.  Denies chest pain, shortness of breath, headache, fever or other systemic symptoms.   Past Medical History Past Medical History:  Diagnosis Date   Knee joint disorder    both knees grind when pt walks per pt   Patient Active Problem List   Diagnosis Date Noted   Perforated gastric ulcer (HCC) 02/22/2021   Acute encephalopathy 07/03/2012   Acute respiratory failure (HCC) 07/03/2012   Accidental drug overdose 07/03/2012   Home Medication(s) Prior to Admission medications   Medication Sig Start Date End Date Taking? Authorizing Provider  acetaminophen (TYLENOL) 500 MG tablet Take 2 tablets (1,000 mg total) by mouth every 8 (eight) hours as needed for mild pain. 02/24/21   Maczis, Elmer Sow, PA-C  amoxicillin-clavulanate (AUGMENTIN) 875-125 MG tablet Take 1 tablet by mouth every 12 (twelve) hours. 02/24/21   Maczis, Elmer Sow, PA-C  pantoprazole (PROTONIX) 40 MG tablet Take 1 tablet (40 mg total) by mouth 2 (two) times daily. 02/24/21   Maczis, Elmer Sow, PA-C                                                                                                                                    Past Surgical History Past Surgical History:  Procedure Laterality Date   HERNIA REPAIR     MANDIBLE FRACTURE SURGERY     Family History Family History  Problem Relation Age of Onset   Heart attack Father     Social History Social  History   Tobacco Use   Smoking status: Every Day    Current packs/day: 0.50    Average packs/day: 0.5 packs/day for 17.0 years (8.5 ttl pk-yrs)    Types: Cigarettes   Smokeless tobacco: Never  Substance Use Topics   Alcohol use: No   Drug use: Yes    Types: Marijuana, Heroin    Comment: 07/03/12 -out of jail 2 days ago - had both drugs x 1 since   Allergies Patient has no known allergies.  Review of Systems Review of Systems  Gastrointestinal:  Positive for abdominal pain, nausea and vomiting.    Physical Exam Vital Signs  I have reviewed the triage vital signs BP (!) 138/103 (BP Location: Left Arm)   Pulse (!) 109   Temp 98 F (36.7 C) (Oral)   Resp 18   Ht 6\' 1"  (1.854 m)   Wt 113.4 kg   SpO2  100%   BMI 32.98 kg/m   Physical Exam Constitutional:      General: He is not in acute distress.    Appearance: Normal appearance. He is ill-appearing.  HENT:     Head: Normocephalic and atraumatic.     Nose: No congestion or rhinorrhea.  Eyes:     General:        Right eye: No discharge.        Left eye: No discharge.     Extraocular Movements: Extraocular movements intact.     Pupils: Pupils are equal, round, and reactive to light.  Cardiovascular:     Rate and Rhythm: Normal rate and regular rhythm.     Heart sounds: No murmur heard. Pulmonary:     Effort: No respiratory distress.     Breath sounds: No wheezing or rales.  Abdominal:     General: There is no distension.     Tenderness: There is abdominal tenderness in the right upper quadrant and epigastric area.  Musculoskeletal:        General: Normal range of motion.     Cervical back: Normal range of motion.  Skin:    General: Skin is warm and dry.  Neurological:     General: No focal deficit present.     Mental Status: He is alert.     ED Results and Treatments Labs (all labs ordered are listed, but only abnormal results are displayed) Labs Reviewed  LIPASE, BLOOD  COMPREHENSIVE METABOLIC PANEL   CBC  URINALYSIS, ROUTINE W REFLEX MICROSCOPIC                                                                                                                          Radiology No results found.  Pertinent labs & imaging results that were available during my care of the patient were reviewed by me and considered in my medical decision making (see MDM for details).  Medications Ordered in ED Medications  ondansetron (ZOFRAN) injection 4 mg (has no administration in time range)                                                                                                                                     Procedures .Ultrasound ED Peripheral IV (Provider)  Date/Time: 04/13/2023 9:47 AM  Performed by: Glendora Score, MD Authorized by: Glendora Score, MD   Procedure details:    Indications:  multiple failed IV attempts     Skin Prep: isopropyl alcohol     Location: r brachial.   Angiocath:  20 G   Bedside Ultrasound Guided: Yes     Images: not archived     Patient tolerated procedure without complications: Yes     Dressing applied: Yes     (including critical care time)  Medical Decision Making / ED Course   This patient presents to the ED for concern of abdominal pain, nausea, vomiting, this involves an extensive number of treatment options, and is a complaint that carries with it a high risk of complications and morbidity.  The differential diagnosis includes GERD/gastritis, peptic ulcer disease, pancreatitis, gastroparesis, pneumonia, pleurisy, pericarditis  MDM: Patient seen in the emergency room for evaluation of abdominal pain nausea and vomiting.  Physical exam with epigastric tenderness to palpation but is otherwise unremarkable.  Laboratory evaluation with a potassium of 3.4, leukocytosis to 11.2 AST 46, ALT 94 but is otherwise unremarkable.  Lipase normal.  Upright chest x-ray obtained to rule out free air which was reassuringly negative.  CT abdomen pelvis with  peptic gastroduodenitis with a small outpouching in this region concerning for ulcer.  Patient given Protonix, Pepcid and is requiring multiple doses of pain medication.  Spoke with the gastroenterologist on-call Dr. Dulce Sellar will have the inpatient GI team evaluate.  Patient admitted for possible EGD in the setting of persistent abdominal pain secondary to suspected peptic ulcer disease.   Additional history obtained: -Additional history obtained from wife -External records from outside source obtained and reviewed including: Chart review including previous notes, labs, imaging, consultation notes   Lab Tests: -I ordered, reviewed, and interpreted labs.   The pertinent results include:   Labs Reviewed  LIPASE, BLOOD  COMPREHENSIVE METABOLIC PANEL  CBC  URINALYSIS, ROUTINE W REFLEX MICROSCOPIC     Imaging Studies ordered: I ordered imaging studies including CTAP, chest x-ray I independently visualized and interpreted imaging. I agree with the radiologist interpretation   Medicines ordered and prescription drug management: Meds ordered this encounter  Medications   ondansetron (ZOFRAN) injection 4 mg    -I have reviewed the patients home medicines and have made adjustments as needed  Critical interventions none  Consultations Obtained: I requested consultation with the gastroenterologist on-call Dr. Dulce Sellar,  and discussed lab and imaging findings as well as pertinent plan - they recommend: PPI, inpatient evaluation   Cardiac Monitoring: The patient was maintained on a cardiac monitor.  I personally viewed and interpreted the cardiac monitored which showed an underlying rhythm of: NSR  Social Determinants of Health:  Factors impacting patients care include: none   Reevaluation: After the interventions noted above, I reevaluated the patient and found that they have :improved  Co morbidities that complicate the patient evaluation  Past Medical History:  Diagnosis Date    Knee joint disorder    both knees grind when pt walks per pt      Dispostion: I considered admission for this patient, and given persistent abdominal pain nausea and vomiting in the setting of a peptic ulcer patient require hospital admission     Final Clinical Impression(s) / ED Diagnoses Final diagnoses:  None     @PCDICTATION @    Glendora Score, MD 04/13/23 [phone]

## 2023-04-13 NOTE — Anesthesia Preprocedure Evaluation (Addendum)
Anesthesia Evaluation    History of Anesthesia Complications (+) history of anesthetic complications  Airway        Dental   Pulmonary Current Smoker and Patient abstained from smoking.          Cardiovascular negative cardio ROS      Neuro/Psych negative neurological ROS  negative psych ROS   GI/Hepatic PUD,,,(+)     substance abuse  Hx/o substance abuse Elevated LFT's Abdominal pain N/V Abnormal CT Scan   Endo/Other  negative endocrine ROS    Renal/GU negative Renal ROS  negative genitourinary   Musculoskeletal negative musculoskeletal ROS (+)    Abdominal   Peds  Hematology   Anesthesia Other Findings   Reproductive/Obstetrics                             Anesthesia Physical Anesthesia Plan  ASA: 2 and emergent  Anesthesia Plan: MAC   Post-op Pain Management: Minimal or no pain anticipated   Induction: Intravenous  PONV Risk Score and Plan: Treatment may vary due to age or medical condition and Propofol infusion  Airway Management Planned: Natural Airway and Nasal Cannula  Additional Equipment: None  Intra-op Plan:   Post-operative Plan:   Informed Consent:   Plan Discussed with:   Anesthesia Plan Comments: (Patient's procedure cancelled.)        Anesthesia Quick Evaluation

## 2023-04-13 NOTE — Consult Note (Signed)
[facility] Consultation Note  Referring Provider: [facility] Primary Care Physician:  Pcp, No Primary Gastroenterologist:  Gentry Fitz  Reason for Consultation:  abdominal pain, nausea, vomiting  HPI: Jack Bush is a 37 y.o. male 1-2 days epigastric pain and nausea/vomiting.  CT suspects ulcer duodenum with inflammation.  Similar presentation, but with self-contained perforation, in 2022, managed medically.  H. Pylori in 2022, reports antibiotic treatment at that time.  No nsaids.  No melena or hematemesis or hematochezia.   Past Medical History:  Diagnosis Date   Knee joint disorder    both knees grind when pt walks per pt    Past Surgical History:  Procedure Laterality Date   HERNIA REPAIR     MANDIBLE FRACTURE SURGERY      Prior to Admission medications   Medication Sig Start Date End Date Taking? Authorizing Provider  pantoprazole (PROTONIX) 40 MG tablet Take 1 tablet (40 mg total) by mouth 2 (two) times daily. Patient taking differently: Take 40 mg by mouth daily. 02/24/21  Yes Maczis, Elmer Sow, PA-C    Current Facility-Administered Medications  Medication Dose Route Frequency Provider Last Rate Last Admin   acetaminophen (TYLENOL) tablet 650 mg  650 mg Oral Q6H PRN Kirby Crigler, Mir M, MD       Or   acetaminophen (TYLENOL) suppository 650 mg  650 mg Rectal Q6H PRN Kirby Crigler, Mir M, MD       albuterol (PROVENTIL) (2.5 MG/3ML) 0.083% nebulizer solution 2.5 mg  2.5 mg Nebulization Q2H PRN Kirby Crigler, Mir M, MD       enoxaparin (LOVENOX) injection 40 mg  40 mg Subcutaneous Q24H Kirby Crigler, Mir M, MD   40 mg at 04/13/23 1327   morphine (PF) 2 MG/ML injection 2 mg  2 mg Intravenous Q2H PRN Kirby Crigler, Mir M, MD       ondansetron [facility]) tablet 4 mg  4 mg Oral Q6H PRN Kirby Crigler, Mir M, MD       Or   ondansetron [facility]) injection 4 mg  4 mg Intravenous Q6H PRN Kirby Crigler, Mir M, MD       oxyCODONE (Oxy IR/ROXICODONE) immediate release tablet 5 mg  5 mg  Oral Q4H PRN Kirby Crigler, Mir M, MD       pantoprazole (PROTONIX) injection 40 mg  40 mg Intravenous Q12H Kirby Crigler, Mir M, MD   40 mg at 04/13/23 1327    Allergies as of 04/13/2023   (No Known Allergies)    Family History  Problem Relation Age of Onset   Heart attack Father     Social History   Socioeconomic History   Marital status: Married    Spouse name: Not on file   Number of children: Not on file   Years of education: Not on file   Highest education level: Not on file  Occupational History   Not on file  Tobacco Use   Smoking status: Every Day    Current packs/day: 0.50    Average packs/day: 0.5 packs/day for 17.0 years (8.5 ttl pk-yrs)    Types: Cigarettes   Smokeless tobacco: Never  Substance and Sexual Activity   Alcohol use: No   Drug use: Yes    Types: Marijuana, Heroin    Comment: 07/03/12 -out of jail 2 days ago - had both drugs x 1 since   Sexual activity: Not on file  Other Topics Concern   Not on file  Social History Narrative   Not on file   Social Determinants of Health  Financial Resource Strain: Not on file  Food Insecurity: No Food Insecurity (04/13/2023)   Hunger Vital Sign    Worried About Running Out of Food in the Last Year: Never true    Ran Out of Food in the Last Year: Never true  Transportation Needs: No Transportation Needs (04/13/2023)   PRAPARE - Administrator, Civil Service (Medical): No    Lack of Transportation (Non-Medical): No  Physical Activity: Not on file  Stress: Not on file  Social Connections: Not on file  Intimate Partner Violence: Not At Risk (04/13/2023)   Humiliation, Afraid, Rape, and Kick questionnaire    Fear of Current or Ex-Partner: No    Emotionally Abused: No    Physically Abused: No    Sexually Abused: No    Review of Systems: As per HPI, all others negative.  Physical Exam: Vital signs in last 24 hours: Temp:  [97.5 F (36.4 C)-98 F (36.7 C)] 97.5 F (36.4 C) (12/06 1403) Pulse  Rate:  [66-109] 88 (12/06 1403) Resp:  [16-18] 16 (12/06 1403) BP: (136-151)/(74-104) 151/83 (12/06 1403) SpO2:  [97 %-100 %] 100 % (12/06 1403) Weight:  [113.4 kg] 113.4 kg (12/06 0912)   General:   Alert,  Well-developed, well-nourished, pleasant and cooperative in NAD Head:  Normocephalic and atraumatic. Eyes:  Sclera clear, no icterus.   Conjunctiva pink. Ears:  Normal auditory acuity. Nose:  No deformity, discharge,  or lesions. Mouth:  No deformity or lesions.  Oropharynx pink & moist. Neck:  Supple; no masses or thyromegaly. Lungs:  Clear throughout to auscultation.   No wheezes, crackles, or rhonchi. No acute distress. Heart:  Regular rate and rhythm; no murmurs, clicks, rubs,  or gallops. Abdomen:  Soft, nontender and nondistended. No masses, hepatosplenomegaly or hernias noted. Normal bowel sounds, without guarding, and without rebound.     Msk:  Symmetrical without gross deformities. Normal posture. Pulses:  Normal pulses noted. Extremities:  Without clubbing or edema. Neurologic:  Alert and  oriented x4;  grossly normal neurologically. Skin:  Intact without significant lesions or rashes. Psych:  Alert and cooperative. Normal mood and affect.   Lab Results: Recent Labs    04/13/23 0950  WBC 11.2*  HGB 16.5  HCT 48.6  PLT 324   BMET Recent Labs    04/13/23 0950  NA 136  K 3.4*  CL 102  CO2 27  GLUCOSE 138*  BUN 10  CREATININE 0.88  CALCIUM 10.1   LFT Recent Labs    04/13/23 0950  PROT 8.3*  ALBUMIN 4.3  AST 46*  ALT 94*  ALKPHOS 86  BILITOT 1.1   PT/INR No results for input(s): "LABPROT", "INR" in the last 72 hours.  Studies/Results: CT ABDOMEN PELVIS W CONTRAST  Result Date: 04/13/2023 CLINICAL DATA:  Epigastric pain.  Nausea and vomiting. EXAM: CT ABDOMEN AND PELVIS WITH CONTRAST TECHNIQUE: Multidetector CT imaging of the abdomen and pelvis was performed using the standard protocol following bolus administration of intravenous contrast.  RADIATION DOSE REDUCTION: This exam was performed according to the departmental dose-optimization program which includes automated exposure control, adjustment of the mA and/or kV according to patient size and/or use of iterative reconstruction technique. CONTRAST:  OMNIPAQUE IOHEXOL 300 MG/ML  SOLN COMPARISON:  CT scan abdomen and pelvis from 02/22/2021. FINDINGS: Lower chest: The lung bases are clear. No pleural effusion. The heart is normal in size. No pericardial effusion. Hepatobiliary: The liver is normal in size. Non-cirrhotic configuration. No suspicious mass. No intrahepatic  or extrahepatic bile duct dilation. No calcified gallstones. Normal gallbladder wall thickness. No pericholecystic inflammatory changes. Pancreas: Unremarkable. No pancreatic ductal dilatation or surrounding inflammatory changes. Spleen: Within normal limits. No focal lesion. Adrenals/Urinary Tract: Adrenal glands are unremarkable. No suspicious renal mass. No hydronephrosis. No renal or ureteric calculi. Unremarkable urinary bladder. Stomach/Bowel: There is subtle fat stranding surrounding the pylorus/duodenal bulb region. There is mild asymmetric mucosal hyperattenuation in this area. There is an approximately 10 x 13 mm outpouching in this region which is concerning for a small ulcer. Clinical correlation and correlation with upper GI endoscopy is recommended. There is no frank perforation. No disproportionate dilation of the small or large bowel loops. No evidence of abnormal bowel wall thickening or inflammatory changes. The appendix is unremarkable. Vascular/Lymphatic: No ascites or pneumoperitoneum. No abdominal or pelvic lymphadenopathy, by size criteria. No aneurysmal dilation of the major abdominal arteries. Reproductive: Normal size prostate. Symmetric seminal vesicles. Other: There is a tiny fat containing umbilical hernia. The soft tissues and abdominal wall are otherwise unremarkable. Musculoskeletal: No suspicious  osseous lesions. IMPRESSION: *Findings favoring inflammation surrounding the pylorus/duodenal bulb region - peptic gastroduodenitis. There is no frank perforation. However, there is a small outpouching in this region, concerning for a small ulcer. *Multiple other nonacute observations, as described above. Electronically Signed   By: Jules Schick M.D.   On: 04/13/2023 11:47   DG Chest Portable 1 View  Result Date: 04/13/2023 CLINICAL DATA:  Two day history of epigastric pain, nausea, and vomiting EXAM: PORTABLE CHEST 1 VIEW COMPARISON:  Chest radiograph dated 07/04/2012 FINDINGS: Normal lung volumes. No focal consolidations. No pleural effusion or pneumothorax. The heart size and mediastinal contours are within normal limits. No acute osseous abnormality. No radiographic finding of free air in the partially imaged upper abdomen. IMPRESSION: 1. No active disease. 2. No radiographic finding of free air in the partially imaged upper abdomen. Electronically Signed   By: Agustin Cree M.D.   On: 04/13/2023 10:43    Impression:   Abdominal pain. Nausea/vomiting. Abnormal imaging:  Suspected ulcer duodenum. H. Pylori positive (serology) in 2022. Admission 2022 self-contained suspected peptic ulcer, managed medically.  Plan:   Clear liquid diet. PPI. NPO after midnight. Endoscopy tomorrow for further evaluation.  Consider gastric biopsies to rule out ongoing H. Pylori. Risks (bleeding, infection, bowel perforation that could require surgery, sedation-related changes in cardiopulmonary systems), benefits (identification and possible treatment of source of symptoms, exclusion of certain causes of symptoms), and alternatives (watchful waiting, radiographic imaging studies, empiric medical treatment) of upper endoscopy (EGD) were explained to patient/family in detail and patient wishes to proceed.    LOS: 0 days   Zalen Sequeira M  04/13/2023, 2:35 PM  Cell [phone] If no answer or after 5 PM call  [phone]

## 2023-04-13 NOTE — H&P (Signed)
History and Physical  Jack Bush XLK:[id number] DOB: 02-Sep-1985 DOA: 04/13/2023  PCP: Pcp, No   Chief Complaint: Abdominal pain, vomiting  HPI: Jack Bush is a 37 y.o. male with medical history significant for perforated gastric ulcer in 2022 managed nonoperatively, as well as H. pylori at that time, being admitted to the hospital with recurrent abdominal pain, vomiting, found to have symptomatic gastroduodenal ulceration.  Patient states that he completed a course of antibiotics a couple of years ago after his hospital stay, and has been on daily Protonix since that time.  He never followed up with gastroenterology.  It looks like he tested positive for H. pylori, completed a course of amoxicillin after hospital discharge.  He has been doing well since that time, does not take any other prescription medications.  Couple days ago, he started getting epigastric abdominal pain which was reminiscent of his prior pain when he had ulceration, he overnight last night he started having nausea and vomiting.  He had some subjective fevers, denies any hematemesis, coffee-ground's, cough, chest pain.  Says that the pain in his abdomen is much more severe than the last time, it hurts more with deep inspiration, abdomen is very tender to palpation.  The location and nature of the pain is the same as a couple years ago, just much more severe this time.  Review of Systems: Please see HPI for pertinent positives and negatives. A complete 10 system review of systems are otherwise negative.  Past Medical History:  Diagnosis Date   Knee joint disorder    both knees grind when pt walks per pt   Past Surgical History:  Procedure Laterality Date   HERNIA REPAIR     MANDIBLE FRACTURE SURGERY      Social History:  reports that he has been smoking. He has a 8.5 pack-year smoking history. He has never used smokeless tobacco. 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 He reports that he does not  drink alcohol.   No Known Allergies  Family History  Problem Relation Age of Onset   Heart attack Father      Prior to Admission medications   Medication Sig Start Date End Date Taking? Authorizing Provider  pantoprazole (PROTONIX) 40 MG tablet Take 1 tablet (40 mg total) by mouth 2 (two) times daily. Patient taking differently: Take 40 mg by mouth daily. 02/24/21  Yes Maczis, Elmer Sow, PA-C    Physical Exam: BP (!) 143/104   Pulse 89   Temp 98 F (36.7 C) (Oral)   Resp 18   Ht 6\' 1"  (1.854 m)   Wt 113.4 kg   SpO2 99%   BMI 32.98 kg/m   General:  Alert, oriented, calm, in no acute distress, pleasant and cooperative, his wife is at the bedside, he looks nontoxic Cardiovascular: RRR, no murmurs or rubs, no peripheral edema  Respiratory: clear to auscultation bilaterally, no wheezes, no crackles  Abdomen: soft, epigastrium is very tender, with rebound tenderness, nondistended, normal bowel tones heard  Skin: dry, no rashes  Musculoskeletal: no joint effusions, normal range of motion  Neurologic: extraocular muscles intact, clear speech, moving all extremities with intact sensorium         Labs on Admission:  Basic Metabolic Panel: Recent Labs  Lab 04/13/23 0950  NA 136  K 3.4*  CL 102  CO2 27  GLUCOSE 138*  BUN 10  CREATININE 0.88  CALCIUM 10.1   Liver Function Tests: Recent Labs  Lab 04/13/23 0950  AST 46*  ALT 94*  ALKPHOS 86  BILITOT 1.1  PROT 8.3*  ALBUMIN 4.3   Recent Labs  Lab 04/13/23 0950  LIPASE 39   No results for input(s): "AMMONIA" in the last 168 hours. CBC: Recent Labs  Lab 04/13/23 0950  WBC 11.2*  HGB 16.5  HCT 48.6  MCV 91.4  PLT 324   Cardiac Enzymes: No results for input(s): "CKTOTAL", "CKMB", "CKMBINDEX", "TROPONINI" in the last 168 hours.  BNP (last 3 results) No results for input(s): "BNP" in the last 8760 hours.  ProBNP (last 3 results) No results for input(s): "PROBNP" in the last 8760 hours.  CBG: No  results for input(s): "GLUCAP" in the last 168 hours.  Radiological Exams on Admission: CT ABDOMEN PELVIS W CONTRAST  Result Date: 04/13/2023 CLINICAL DATA:  Epigastric pain.  Nausea and vomiting. EXAM: CT ABDOMEN AND PELVIS WITH CONTRAST TECHNIQUE: Multidetector CT imaging of the abdomen and pelvis was performed using the standard protocol following bolus administration of intravenous contrast. RADIATION DOSE REDUCTION: This exam was performed according to the departmental dose-optimization program which includes automated exposure control, adjustment of the mA and/or kV according to patient size and/or use of iterative reconstruction technique. CONTRAST:  OMNIPAQUE IOHEXOL 300 MG/ML  SOLN COMPARISON:  CT scan abdomen and pelvis from 02/22/2021. FINDINGS: Lower chest: The lung bases are clear. No pleural effusion. The heart is normal in size. No pericardial effusion. Hepatobiliary: The liver is normal in size. Non-cirrhotic configuration. No suspicious mass. No intrahepatic or extrahepatic bile duct dilation. No calcified gallstones. Normal gallbladder wall thickness. No pericholecystic inflammatory changes. Pancreas: Unremarkable. No pancreatic ductal dilatation or surrounding inflammatory changes. Spleen: Within normal limits. No focal lesion. Adrenals/Urinary Tract: Adrenal glands are unremarkable. No suspicious renal mass. No hydronephrosis. No renal or ureteric calculi. Unremarkable urinary bladder. Stomach/Bowel: There is subtle fat stranding surrounding the pylorus/duodenal bulb region. There is mild asymmetric mucosal hyperattenuation in this area. There is an approximately 10 x 13 mm outpouching in this region which is concerning for a small ulcer. Clinical correlation and correlation with upper GI endoscopy is recommended. There is no frank perforation. No disproportionate dilation of the small or large bowel loops. No evidence of abnormal bowel wall thickening or inflammatory changes. The  appendix is unremarkable. Vascular/Lymphatic: No ascites or pneumoperitoneum. No abdominal or pelvic lymphadenopathy, by size criteria. No aneurysmal dilation of the major abdominal arteries. Reproductive: Normal size prostate. Symmetric seminal vesicles. Other: There is a tiny fat containing umbilical hernia. The soft tissues and abdominal wall are otherwise unremarkable. Musculoskeletal: No suspicious osseous lesions. IMPRESSION: *Findings favoring inflammation surrounding the pylorus/duodenal bulb region - peptic gastroduodenitis. There is no frank perforation. However, there is a small outpouching in this region, concerning for a small ulcer. *Multiple other nonacute observations, as described above. Electronically Signed   By: Jules Schick M.D.   On: 04/13/2023 11:47   DG Chest Portable 1 View  Result Date: 04/13/2023 CLINICAL DATA:  Two day history of epigastric pain, nausea, and vomiting EXAM: PORTABLE CHEST 1 VIEW COMPARISON:  Chest radiograph dated 07/04/2012 FINDINGS: Normal lung volumes. No focal consolidations. No pleural effusion or pneumothorax. The heart size and mediastinal contours are within normal limits. No acute osseous abnormality. No radiographic finding of free air in the partially imaged upper abdomen. IMPRESSION: 1. No active disease. 2. No radiographic finding of free air in the partially imaged upper abdomen. Electronically Signed   By: Agustin Cree M.D.   On: 04/13/2023 10:43  Assessment/Plan Jack Bush is a 37 y.o. male with medical history significant for perforated gastric ulcer in 2022 managed nonoperatively, as well as H. pylori at that time, being admitted to the hospital with recurrent abdominal pain, vomiting, found to have symptomatic gastroduodenal ulceration.   Gastroduodenal ulcer-with abdominal pain, vomiting, CT findings as above.  No evidence of perforation. -Observation admission -Clear liquid diet -Pain and nausea medication as needed -ER provider  discussed with gastroenterology will consult, may benefit from eventual EGD -IV PPI twice daily  Abdominal pain-patient states his abdominal pain is similar in nature, however much more severe than when he had a perforation.  I do not have a good explanation for this, however the patient's pain is clearly in his abdomen, and tender to palpation.  While he describes worsening pain with deep inspiration, I feel this is likely due to his diaphragm pushing down on his abdominal contents.  He does not have shortness of breath, he is saturating 99% on room air so I doubt PE, or cardiac cause.  History of H. pylori-in 2022, seems was only treated with Augmentin and PPI -Urea breath test  Hypokalemia-minimal and inconsequential, likely due to recent vomiting -Will monitor with daily labs, may benefit from replacement in the morning  DVT prophylaxis: Lovenox     Code Status: Full Code  Consults called: Gastroenterology  Admission status: Observation  Time spent: 66 minutes  Jack Bush Sharlette Dense MD [facility] Pager [phone]  If 7PM-7AM, please contact night-coverage [email or web address] Password [email or web address]  04/13/2023, 1:32 PM

## 2023-04-14 ENCOUNTER — Encounter (HOSPITAL_COMMUNITY)
Admission: EM | Discharge status: Against Medical Advice | Payer: Self-pay | Source: Home / Self Care | Attending: Student

## 2023-04-14 ENCOUNTER — Encounter (HOSPITAL_COMMUNITY): Payer: Self-pay | Admitting: Internal Medicine

## 2023-04-14 LAB — CBC WITH DIFFERENTIAL/PLATELET
Abs Immature Granulocytes: 0.01 10*3/uL (ref 0.00–0.07)
Basophils Absolute: 0 10*3/uL (ref 0.0–0.1)
Basophils Relative: 0 %
Eosinophils Absolute: 0.1 10*3/uL (ref 0.0–0.5)
Eosinophils Relative: 1 %
HCT: 45.8 % (ref 39.0–52.0)
Hemoglobin: 15.1 g/dL (ref 13.0–17.0)
Immature Granulocytes: 0 %
Lymphocytes Relative: 41 %
Lymphs Abs: 3.3 10*3/uL (ref 0.7–4.0)
MCH: 30.4 pg (ref 26.0–34.0)
MCHC: 33 g/dL (ref 30.0–36.0)
MCV: 92.3 fL (ref 80.0–100.0)
Monocytes Absolute: 0.6 10*3/uL (ref 0.1–1.0)
Monocytes Relative: 7 %
Neutro Abs: 4 10*3/uL (ref 1.7–7.7)
Neutrophils Relative %: 51 %
Platelets: 305 10*3/uL (ref 150–400)
RBC: 4.96 MIL/uL (ref 4.22–5.81)
RDW: 13.2 % (ref 11.5–15.5)
WBC: 8 10*3/uL (ref 4.0–10.5)
nRBC: 0 % (ref 0.0–0.2)

## 2023-04-14 LAB — CBC
HCT: 45 % (ref 39.0–52.0)
Hemoglobin: 14.8 g/dL (ref 13.0–17.0)
MCH: 30.5 pg (ref 26.0–34.0)
MCHC: 32.9 g/dL (ref 30.0–36.0)
MCV: 92.6 fL (ref 80.0–100.0)
Platelets: 313 10*3/uL (ref 150–400)
RBC: 4.86 MIL/uL (ref 4.22–5.81)
RDW: 13.1 % (ref 11.5–15.5)
WBC: 9.9 10*3/uL (ref 4.0–10.5)
nRBC: 0 % (ref 0.0–0.2)

## 2023-04-14 LAB — BASIC METABOLIC PANEL
Anion gap: 7 (ref 5–15)
BUN: 9 mg/dL (ref 6–20)
CO2: 21 mmol/L — ABNORMAL LOW (ref 22–32)
Calcium: 8.4 mg/dL — ABNORMAL LOW (ref 8.9–10.3)
Chloride: 105 mmol/L (ref 98–111)
Creatinine, Ser: 0.91 mg/dL (ref 0.61–1.24)
GFR, Estimated: >60 mL/min (ref >60)
Glucose, Bld: 97 mg/dL (ref 70–99)
Potassium: 3.5 mmol/L (ref 3.5–5.1)
Sodium: 133 mmol/L — ABNORMAL LOW (ref 135–145)

## 2023-04-14 SURGERY — CANCELLED PROCEDURE
Anesthesia: Monitor Anesthesia Care | Laterality: Left

## 2023-04-14 SURGICAL SUPPLY — 12 items
BLOCK BITE 60FR ADLT L/F BLUE (MISCELLANEOUS) ×2
ELECT REM PT RETURN 9FT ADLT (ELECTROSURGICAL)
FORCEP RJ3 GP 1.8X160 W-NEEDLE (CUTTING FORCEPS)
FORCEPS BIOP RAD 4 LRG CAP 4 (CUTTING FORCEPS)
NEEDLE SCLEROTHERAPY 25GX240 (NEEDLE)
PROBE APC STR FIRE (PROBE)
PROBE INJECTION GOLD 7FR (MISCELLANEOUS)
SNARE SHORT THROW 13M SML OVAL (MISCELLANEOUS)
SYR 50ML LL SCALE MARK (SYRINGE)
TUBING ENDO SMARTCAP PENTAX (MISCELLANEOUS) ×4
TUBING IRRIGATION ENDOGATOR (MISCELLANEOUS) ×2
WATER STERILE IRR 1000ML POUR (IV SOLUTION)

## 2023-04-14 NOTE — Progress Notes (Signed)
PROGRESS NOTE    Jack Bush  NWG:[id number] DOB: 1985-12-21 DOA: 04/13/2023 PCP: Oneita Hurt, No   Brief Narrative:  HPI: Jack Bush is a 37 y.o. male with medical history significant for perforated gastric ulcer in 2022 managed nonoperatively, as well as H. pylori at that time, being admitted to the hospital with recurrent abdominal pain, vomiting, found to have symptomatic gastroduodenal ulceration.  Patient states that he completed a course of antibiotics a couple of years ago after his hospital stay, and has been on daily Protonix since that time.  He never followed up with gastroenterology.  It looks like he tested positive for H. pylori, completed a course of amoxicillin after hospital discharge.  He has been doing well since that time, does not take any other prescription medications.  Couple days ago, he started getting epigastric abdominal pain which was reminiscent of his prior pain when he had ulceration, he overnight last night he started having nausea and vomiting.  He had some subjective fevers, denies any hematemesis, coffee-ground's, cough, chest pain.  Says that the pain in his abdomen is much more severe than the last time, it hurts more with deep inspiration, abdomen is very tender to palpation.  The location and nature of the pain is the same as a couple years ago, just much more severe this time.   Assessment & Plan:   Principal Problem:   Duodenal ulcer due to bacteria  Abdominal pain due to gastroduodenal ulcer-fortunately hemoglobin is stable and his pain has resolved as well.  Continue PPI.  GI on board.  Plan for EGD today.   History of H. pylori-in 2022, seems was only treated with Augmentin and PPI   Hypokalemia-resolved  Mild hyponatremia: Monitor.  DVT prophylaxis: enoxaparin (LOVENOX) injection 40 mg Start: 04/13/23 1315 SCDs Start: 04/13/23 1313   Code Status: Full Code  Family Communication: Wife present at bedside.  Plan of care discussed with patient in  length and he/she verbalized understanding and agreed with it.  Status is: Observation The patient will require care spanning > 2 midnights and should be moved to inpatient because: EGD planned today   Estimated body mass index is 32.98 kg/m as calculated from the following:   Height as of this encounter: 6\' 1"  (1.854 m).   Weight as of this encounter: 113.4 kg.    Nutritional Assessment: Body mass index is 32.98 kg/m.Marland Kitchen Seen by dietician.  I agree with the assessment and plan as outlined below: Nutrition Status:        . Skin Assessment: I have examined the patient's skin and I agree with the wound assessment as performed by the wound care RN as outlined below:    Consultants:  GI  Procedures:  As above  Antimicrobials:  Anti-infectives (From admission, onward)    None         Subjective: Patient seen and examined.  He has no complaints.  No abdominal pain.  Objective: Vitals:   04/13/23 1403 04/13/23 1739 04/13/23 2136 04/14/23 0500  BP: (!) 151/83 (!) 153/92 125/75 131/68  Pulse: 88 (!) 101 97 88  Resp: 16 16 20 20   Temp: (!) 97.5 F (36.4 C) 97.8 F (36.6 C) 97.8 F (36.6 C) 97.9 F (36.6 C)  TempSrc: Oral Oral Oral Oral  SpO2: 100% 100% 98% 100%  Weight:      Height:        Intake/Output Summary (Last 24 hours) at 04/14/2023 0849 Last data filed at 04/14/2023 0600 Gross per  24 hour  Intake 1900 ml  Output 300 ml  Net 1600 ml   Filed Weights   04/13/23 0912  Weight: 113.4 kg    Examination:  General exam: Appears calm and comfortable  Respiratory system: Clear to auscultation. Respiratory effort normal. Cardiovascular system: S1 & S2 heard, RRR. No JVD, murmurs, rubs, gallops or clicks. No pedal edema. Gastrointestinal system: Abdomen is nondistended, soft and nontender. No organomegaly or masses felt. Normal bowel sounds heard. Central nervous system: Alert and oriented. No focal neurological deficits. Extremities: Symmetric 5 x 5  power. Skin: No rashes, lesions or ulcers Psychiatry: Judgement and insight appear normal. Mood & affect appropriate.    Data Reviewed: I have personally reviewed following labs and imaging studies  CBC: Recent Labs  Lab 04/13/23 0950 04/14/23 0431  WBC 11.2* 9.9  HGB 16.5 14.8  HCT 48.6 45.0  MCV 91.4 92.6  PLT 324 313   Basic Metabolic Panel: Recent Labs  Lab 04/13/23 0950 04/14/23 0431  NA 136 133*  K 3.4* 3.5  CL 102 105  CO2 27 21*  GLUCOSE 138* 97  BUN 10 9  CREATININE 0.88 0.91  CALCIUM 10.1 8.4*   GFR: Estimated Creatinine Clearance: 148.1 mL/min (by C-G formula based on SCr of 0.91 mg/dL). Liver Function Tests: Recent Labs  Lab 04/13/23 0950  AST 46*  ALT 94*  ALKPHOS 86  BILITOT 1.1  PROT 8.3*  ALBUMIN 4.3   Recent Labs  Lab 04/13/23 0950  LIPASE 39   No results for input(s): "AMMONIA" in the last 168 hours. Coagulation Profile: No results for input(s): "INR", "PROTIME" in the last 168 hours. Cardiac Enzymes: No results for input(s): "CKTOTAL", "CKMB", "CKMBINDEX", "TROPONINI" in the last 168 hours. BNP (last 3 results) No results for input(s): "PROBNP" in the last 8760 hours. HbA1C: No results for input(s): "HGBA1C" in the last 72 hours. CBG: No results for input(s): "GLUCAP" in the last 168 hours. Lipid Profile: No results for input(s): "CHOL", "HDL", "LDLCALC", "TRIG", "CHOLHDL", "LDLDIRECT" in the last 72 hours. Thyroid Function Tests: No results for input(s): "TSH", "T4TOTAL", "FREET4", "T3FREE", "THYROIDAB" in the last 72 hours. Anemia Panel: No results for input(s): "VITAMINB12", "FOLATE", "FERRITIN", "TIBC", "IRON", "RETICCTPCT" in the last 72 hours. Sepsis Labs: No results for input(s): "PROCALCITON", "LATICACIDVEN" in the last 168 hours.  No results found for this or any previous visit (from the past 240 hour(s)).   Radiology Studies: CT ABDOMEN PELVIS W CONTRAST  Result Date: 04/13/2023 CLINICAL DATA:  Epigastric pain.   Nausea and vomiting. EXAM: CT ABDOMEN AND PELVIS WITH CONTRAST TECHNIQUE: Multidetector CT imaging of the abdomen and pelvis was performed using the standard protocol following bolus administration of intravenous contrast. RADIATION DOSE REDUCTION: This exam was performed according to the departmental dose-optimization program which includes automated exposure control, adjustment of the mA and/or kV according to patient size and/or use of iterative reconstruction technique. CONTRAST:  OMNIPAQUE IOHEXOL 300 MG/ML  SOLN COMPARISON:  CT scan abdomen and pelvis from 02/22/2021. FINDINGS: Lower chest: The lung bases are clear. No pleural effusion. The heart is normal in size. No pericardial effusion. Hepatobiliary: The liver is normal in size. Non-cirrhotic configuration. No suspicious mass. No intrahepatic or extrahepatic bile duct dilation. No calcified gallstones. Normal gallbladder wall thickness. No pericholecystic inflammatory changes. Pancreas: Unremarkable. No pancreatic ductal dilatation or surrounding inflammatory changes. Spleen: Within normal limits. No focal lesion. Adrenals/Urinary Tract: Adrenal glands are unremarkable. No suspicious renal mass. No hydronephrosis. No renal or ureteric calculi. Unremarkable  urinary bladder. Stomach/Bowel: There is subtle fat stranding surrounding the pylorus/duodenal bulb region. There is mild asymmetric mucosal hyperattenuation in this area. There is an approximately 10 x 13 mm outpouching in this region which is concerning for a small ulcer. Clinical correlation and correlation with upper GI endoscopy is recommended. There is no frank perforation. No disproportionate dilation of the small or large bowel loops. No evidence of abnormal bowel wall thickening or inflammatory changes. The appendix is unremarkable. Vascular/Lymphatic: No ascites or pneumoperitoneum. No abdominal or pelvic lymphadenopathy, by size criteria. No aneurysmal dilation of the major abdominal  arteries. Reproductive: Normal size prostate. Symmetric seminal vesicles. Other: There is a tiny fat containing umbilical hernia. The soft tissues and abdominal wall are otherwise unremarkable. Musculoskeletal: No suspicious osseous lesions. IMPRESSION: *Findings favoring inflammation surrounding the pylorus/duodenal bulb region - peptic gastroduodenitis. There is no frank perforation. However, there is a small outpouching in this region, concerning for a small ulcer. *Multiple other nonacute observations, as described above. Electronically Signed   By: Jules Schick M.D.   On: 04/13/2023 11:47   DG Chest Portable 1 View  Result Date: 04/13/2023 CLINICAL DATA:  Two day history of epigastric pain, nausea, and vomiting EXAM: PORTABLE CHEST 1 VIEW COMPARISON:  Chest radiograph dated 07/04/2012 FINDINGS: Normal lung volumes. No focal consolidations. No pleural effusion or pneumothorax. The heart size and mediastinal contours are within normal limits. No acute osseous abnormality. No radiographic finding of free air in the partially imaged upper abdomen. IMPRESSION: 1. No active disease. 2. No radiographic finding of free air in the partially imaged upper abdomen. Electronically Signed   By: Agustin Cree M.D.   On: 04/13/2023 10:43    Scheduled Meds:  enoxaparin (LOVENOX) injection  40 mg Subcutaneous Q24H   pantoprazole (PROTONIX) IV  40 mg Intravenous Q12H   Continuous Infusions:   LOS: 0 days   Hughie Closs, MD [facility]  04/14/2023, 8:49 AM   *Please note that this is a verbal dictation therefore any spelling or grammatical errors are due to the "Dragon [employer] One" system interpretation.  Please page via Amion and do not message via secure chat for urgent patient care matters. Secure chat can be used for non urgent patient care matters.  How to contact the [facility] Attending or Consulting provider 7A - 7P or covering provider during after hours 7P -7A, for this patient?  Check the care team  in [facility] and look for a) attending/consulting [facility] provider listed and b) the [facility] team listed. Page or secure chat 7A-7P. Log into [email or web address] and use [place]'s universal password to access. If you do not have the password, please contact the hospital operator. Locate the [facility] provider you are looking for under [facility] and page to a number that you can be directly reached. If you still have difficulty reaching the provider, please page the [facility] (Director on Call) for the Hospitalists listed on amion for assistance.

## 2023-04-14 NOTE — Progress Notes (Addendum)
Lovena Le 11:17 AM  Subjective: Patient seen and examined and his hospital computer chart reviewed and his case discussed with his significant other as well as my partner Dr. Dulce Sellar and he is feeling better only had pain for 2 days has been on his pantoprazole for 2 years and has not been on aspirin or nonsteroidals but has been under stress and he has no lower bowel complaints his family history is negative and we discussed the endoscopy  Objective: Vital signs stable afebrile no acute distress abdomen is soft nontender rest of exam please see preassessment evaluation labs okay CT reviewed  Assessment: Resolved abdominal pain with abnormal CT and history of perforated ulcer  Plan: Will proceed with endoscopy later today with anesthesia assistance with further workup and plan and diet advancement pending those findings and we discussed H. pylori as well and will probably biopsy for that but could also consider a stool test as well  [facility]  office [phone] After 5PM or if no answer call [phone]

## 2023-04-14 NOTE — Plan of Care (Signed)
?  Problem: Clinical Measurements: ?Goal: Will remain free from infection ?Outcome: Progressing ?  ?

## 2023-04-14 NOTE — Progress Notes (Signed)
Called to pt's room, pt and his girlfriend state that they are ready to be discharged. Talked w pt and girlfriend about delay in endo scheduling d/t emergencies, pt very restless in the rm, pacing at times, slight tremor to his hands. Pt very ademant that he cannot wait for this procedure, that he must leave, that he is hungry (girlfriend eating multiple times in the room in front of him this am) and he has to leave. Pt indicates that he doesn't want to pull his own IV out, asking me to d/c his IV. Asked pt to stay and allow me to call his MD so that he can talk w MD, pt refused. Stated "I have already talked w him, so I don't need to talk anymore." AMA form signed, IV removed and pt left ambulatory w his girlfriend via elevators. MD notified of above.

## 2023-04-14 NOTE — Discharge Summary (Signed)
@LOGO @   PT LEFT AMA SUMMARY  MAXUM KNOBEL MRN - [id number] DOB - 1985/07/30  Date of Admission - 04/13/2023 Date LEFT AMA: 04/14/2023  Attending Physician:  Hughie Closs, MD  Patient's PCP:  Pcp, No  Disposition: LEFT AMA  Follow-up Appts:  Not able to be arranged or discussed as pt LEFT AMA  Diagnoses at time pt LEFT AMA: Abdominal pain due to gastroduodenal ulcer/hypokalemia/mild hyponatremia  Initial presentation: HPI: Jack Bush is a 37 y.o. male with medical history significant for perforated gastric ulcer in 2022 managed nonoperatively, as well as H. pylori at that time, being admitted to the hospital with recurrent abdominal pain, vomiting, found to have symptomatic gastroduodenal ulceration.  Patient states that he completed a course of antibiotics a couple of years ago after his hospital stay, and has been on daily Protonix since that time.  He never followed up with gastroenterology.  It looks like he tested positive for H. pylori, completed a course of amoxicillin after hospital discharge.  He has been doing well since that time, does not take any other prescription medications.  Couple days ago, he started getting epigastric abdominal pain which was reminiscent of his prior pain when he had ulceration, he overnight last night he started having nausea and vomiting.  He had some subjective fevers, denies any hematemesis, coffee-ground's, cough, chest pain.  Says that the pain in his abdomen is much more severe than the last time, it hurts more with deep inspiration, abdomen is very tender to palpation.  The location and nature of the pain is the same as a couple years ago, just much more severe this time.   Hospital Course: Listed below are the active problems present, and the status of the care of these problems, at the time the pt decided to LEAVE AMA:  Abdominal pain due to gastroduodenal ulcer-fortunately hemoglobin is stable and his pain has resolved as well.  Continue  PPI.  GI on board.  Plan was for EGD later today.  I did discuss this with the patient and so did the GI.  I also discussed with him that the plan would be to observe him overnight and repeat labs in the morning, slowly advance his diet and hopefully discharge him home tomorrow.  However, later on, patient was found to be pacing in the room, anxious and wanted to leave AGAINST MEDICAL ADVICE.  I received a message from the nurse about this but by the time I was able to review the message, patient had already left the building signing out AMA.   History of H. pylori-in 2022, seems was only treated with Augmentin and PPI   Hypokalemia-resolved   Mild hyponatremia: Monitor.    Medication List    Unable to be finalized as pt LEFT AMA  Day of Discharge Wt Readings from Last 3 Encounters:  04/13/23 113.4 kg  02/22/21 99.8 kg  07/03/12 102.7 kg   Temp Readings from Last 3 Encounters:  04/14/23 97.9 F (36.6 C) (Oral)  02/24/21 98.5 F (36.9 C) (Oral)  07/04/12 99.1 F (37.3 C) (Oral)   BP Readings from Last 3 Encounters:  04/14/23 131/68  02/24/21 (!) 153/85  07/04/12 (!) 144/84   Pulse Readings from Last 3 Encounters:  04/14/23 88  02/24/21 75  07/04/12 93    Physical Exam: Exam not able to be completed at time of d/c as pt LEFT AMA however I examined the patient earlier today, please refer to my progress note on the  same day.  12:55 PM 04/14/23  Hughie Closs, MD [facility] Office  [phone]

## 2023-04-20 ENCOUNTER — Emergency Department (HOSPITAL_COMMUNITY)
Admission: EM | Admit: 2023-04-20 | Discharge: 2023-04-20 | Disposition: A | Discharge status: Home / Self Care | Payer: Self-pay | Attending: Emergency Medicine | Admitting: Emergency Medicine

## 2023-04-20 ENCOUNTER — Other Ambulatory Visit: Payer: Self-pay

## 2023-04-20 ENCOUNTER — Encounter (HOSPITAL_COMMUNITY): Payer: Self-pay

## 2023-04-20 DIAGNOSIS — K279 Peptic ulcer, site unspecified, unspecified as acute or chronic, without hemorrhage or perforation: Secondary | ICD-10-CM | POA: Insufficient documentation

## 2023-04-20 DIAGNOSIS — D72829 Elevated white blood cell count, unspecified: Secondary | ICD-10-CM | POA: Insufficient documentation

## 2023-04-20 LAB — CBC WITH DIFFERENTIAL/PLATELET
Abs Immature Granulocytes: 0.03 10*3/uL (ref 0.00–0.07)
Basophils Absolute: 0 10*3/uL (ref 0.0–0.1)
Basophils Relative: 0 %
Eosinophils Absolute: 0.1 10*3/uL (ref 0.0–0.5)
Eosinophils Relative: 1 %
HCT: 54.2 % — ABNORMAL HIGH (ref 39.0–52.0)
Hemoglobin: 17.4 g/dL — ABNORMAL HIGH (ref 13.0–17.0)
Immature Granulocytes: 0 %
Lymphocytes Relative: 23 %
Lymphs Abs: 2.6 10*3/uL (ref 0.7–4.0)
MCH: 29.9 pg (ref 26.0–34.0)
MCHC: 32.1 g/dL (ref 30.0–36.0)
MCV: 93.1 fL (ref 80.0–100.0)
Monocytes Absolute: 0.7 10*3/uL (ref 0.1–1.0)
Monocytes Relative: 6 %
Neutro Abs: 7.8 10*3/uL — ABNORMAL HIGH (ref 1.7–7.7)
Neutrophils Relative %: 70 %
Platelets: 353 10*3/uL (ref 150–400)
RBC: 5.82 MIL/uL — ABNORMAL HIGH (ref 4.22–5.81)
RDW: 12.8 % (ref 11.5–15.5)
WBC: 11.3 10*3/uL — ABNORMAL HIGH (ref 4.0–10.5)
nRBC: 0 % (ref 0.0–0.2)

## 2023-04-20 LAB — COMPREHENSIVE METABOLIC PANEL
ALT: 93 U/L — ABNORMAL HIGH (ref 0–44)
AST: 46 U/L — ABNORMAL HIGH (ref 15–41)
Albumin: 4.5 g/dL (ref 3.5–5.0)
Alkaline Phosphatase: 88 U/L (ref 38–126)
Anion gap: 9 (ref 5–15)
BUN: 8 mg/dL (ref 6–20)
CO2: 29 mmol/L (ref 22–32)
Calcium: 9.7 mg/dL (ref 8.9–10.3)
Chloride: 101 mmol/L (ref 98–111)
Creatinine, Ser: 1.2 mg/dL (ref 0.61–1.24)
GFR, Estimated: >60 mL/min (ref >60)
Glucose, Bld: 114 mg/dL — ABNORMAL HIGH (ref 70–99)
Potassium: 4.1 mmol/L (ref 3.5–5.1)
Sodium: 139 mmol/L (ref 135–145)
Total Bilirubin: 1 mg/dL (ref <1.2)
Total Protein: 8.5 g/dL — ABNORMAL HIGH (ref 6.5–8.1)

## 2023-04-20 LAB — LIPASE, BLOOD: Lipase: 38 U/L (ref 11–51)

## 2023-04-20 MED ORDER — PROMETHAZINE HCL 25 MG PO TABS: 25.0000 mg | ORAL_TABLET | Freq: Four times a day (QID) | ORAL | 0 refills | Status: AC | PRN

## 2023-04-20 MED ORDER — MORPHINE SULFATE (PF) 4 MG/ML IV SOLN
4.0000 mg | Freq: Once | INTRAVENOUS | Status: AC
  Administered 2023-04-20: 4 mg via INTRAVENOUS
  Filled 2023-04-20: qty 1

## 2023-04-20 MED ORDER — SUCRALFATE 1 G PO TABS: 1.0000 g | ORAL_TABLET | Freq: Three times a day (TID) | ORAL | 1 refills | Status: AC

## 2023-04-20 MED ORDER — METOCLOPRAMIDE HCL 5 MG/ML IJ SOLN
10.0000 mg | Freq: Once | INTRAMUSCULAR | Status: AC
  Administered 2023-04-20: 10 mg via INTRAVENOUS
  Filled 2023-04-20: qty 2

## 2023-04-20 MED ORDER — LACTATED RINGERS IV SOLN: INTRAVENOUS | Status: DC

## 2023-04-20 MED ORDER — PANTOPRAZOLE SODIUM 40 MG PO TBEC: 40.0000 mg | DELAYED_RELEASE_TABLET | Freq: Every day | ORAL | 1 refills | Status: AC

## 2023-04-20 MED ORDER — PANTOPRAZOLE SODIUM 40 MG IV SOLR
40.0000 mg | Freq: Once | INTRAVENOUS | Status: AC
  Administered 2023-04-20: 40 mg via INTRAVENOUS
  Filled 2023-04-20: qty 10

## 2023-04-20 MED ORDER — LACTATED RINGERS IV BOLUS
1000.0000 mL | Freq: Once | INTRAVENOUS | Status: AC
  Administered 2023-04-20: 1000 mL via INTRAVENOUS

## 2023-04-20 NOTE — ED Provider Notes (Signed)
EMERGENCY DEPARTMENT AT [facility] Provider Note   CSN: [id number] Arrival date & time: 04/20/23  1150     History  Chief Complaint  Patient presents with   Abdominal Pain   Nausea    Jack Bush is a 37 y.o. male.  Pt is a 37y/o male with hx of perforated gastric ulcer in 2022 managed nonoperatively, as well as H. 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  Patient was seen on 04/13/2023 for similar symptoms and at that time was thought to have recurrent PUD.  He was admitted to the hospital and was awaiting endoscopy but he reports he became impatient and checked out AMA on the seventh.  Initially he was given Protonix and IV fluids and reports he was feeling much better and for several days after leaving he felt okay but then the pain started returning and has gradually worsened since that time.  He thought he had Protonix at home but he did not so he has not been taking anything since getting home.  He denies any alcohol use, NSAIDs or aspirin.  He has had recurrent vomiting in the last 2 days and been unable to hold anything down.  He does describe some blood-tinged emesis.  He denies any fever.  The pain is mostly in the epigastrium but does shoot over into the left upper quadrant at times.  Last bowel movement was today and those have been normal.  The history is provided by the patient.  Abdominal Pain      Home Medications Prior to Admission medications   Medication Sig Start Date End Date Taking? Authorizing Provider  promethazine (PHENERGAN) 25 MG tablet Take 1 tablet (25 mg total) by mouth every 6 (six) hours as needed for nausea or vomiting. 04/20/23  Yes Trigger Frasier, Alphonzo Lemmings, MD  sucralfate (CARAFATE) 1 g tablet Take 1 tablet (1 g total) by mouth 4 (four) times daily -  with meals and at bedtime. 04/20/23  Yes Raynetta Osterloh, Alphonzo Lemmings, MD  pantoprazole (PROTONIX)  40 MG tablet Take 1 tablet (40 mg total) by mouth daily. 04/20/23   Gwyneth Sprout, MD      Allergies    Patient has no known allergies.    Review of Systems   Review of Systems  Gastrointestinal:  Positive for abdominal pain.    Physical Exam Updated Vital Signs BP (!) 143/118   Pulse 89   Temp (!) 97.5 F (36.4 C) (Oral)   Resp 18   Ht 6\' 1"  (1.854 m)   Wt 113 kg   SpO2 95%   BMI 32.87 kg/m  Physical Exam Vitals and nursing note reviewed.  Constitutional:      General: He is not in acute distress.    Appearance: He is well-developed.  HENT:     Head: Normocephalic and atraumatic.     Mouth/Throat:     Mouth: Mucous membranes are dry.  Eyes:     Conjunctiva/sclera: Conjunctivae normal.     Pupils: Pupils are equal, round, and reactive to light.  Cardiovascular:     Rate and Rhythm: Regular rhythm. Tachycardia present.     Heart sounds: No murmur heard. Pulmonary:     Effort: Pulmonary effort is normal. No respiratory distress.     Breath sounds: Normal breath sounds. No wheezing or rales.  Abdominal:     General: There is no distension.  Palpations: Abdomen is soft.     Tenderness: There is abdominal tenderness in the epigastric area. There is guarding. There is no right CVA tenderness, left CVA tenderness or rebound.  Musculoskeletal:        General: No tenderness. Normal range of motion.     Cervical back: Normal range of motion and neck supple.     Right lower leg: No edema.     Left lower leg: No edema.  Skin:    General: Skin is warm and dry.     Findings: No erythema or rash.  Neurological:     Mental Status: He is alert and oriented to person, place, and time. Mental status is at baseline.  Psychiatric:        Behavior: Behavior normal.     ED Results / Procedures / Treatments   Labs (all labs ordered are listed, but only abnormal results are displayed) Labs Reviewed  CBC WITH DIFFERENTIAL/PLATELET - Abnormal; Notable for the following  components:      Result Value   WBC 11.3 (*)    RBC 5.82 (*)    Hemoglobin 17.4 (*)    HCT 54.2 (*)    Neutro Abs 7.8 (*)    All other components within normal limits  COMPREHENSIVE METABOLIC PANEL - Abnormal; Notable for the following components:   Glucose, Bld 114 (*)    Total Protein 8.5 (*)    AST 46 (*)    ALT 93 (*)    All other components within normal limits  LIPASE, BLOOD    EKG None  Radiology No results found.  Procedures Procedures    Medications Ordered in ED Medications  lactated ringers infusion ( Intravenous New Bag/Given 04/20/23 1442)  morphine (PF) 4 MG/ML injection 4 mg (4 mg Intravenous Given 04/20/23 1346)  metoCLOPramide (REGLAN) injection 10 mg (10 mg Intravenous Given 04/20/23 1344)  pantoprazole (PROTONIX) injection 40 mg (40 mg Intravenous Given 04/20/23 1341)  lactated ringers bolus 1,000 mL (1,000 mLs Intravenous New Bag/Given 04/20/23 1340)    ED Course/ Medical Decision Making/ A&P                                 Medical Decision Making Amount and/or Complexity of Data Reviewed Labs: ordered. Decision-making details documented in ED Course.  Risk Prescription drug management.   Pt with multiple medical problems and comorbidities and presenting today with a complaint that caries a high risk for morbidity and mortality.  Returning for the above symptoms and concern for PUD versus developing pancreatitis, electrolyte abnormality and dehydration.  Also concern for possible AKI.  Patient has no history of cirrhosis and low suspicion for esophageal varices.  Patient given IV fluids, Protonix, antiemetics and pain control.  Labs are pending.  4:50 PM CBC with minimal leukocytosis of 11 and hemoconcentration with a hemoglobin of 17, platelet count normal, CMP with mild elevated LFTs with an AST of 46 and ALT of 93 which is not significantly different from prior.  Creatinine of 1.2 and normal electrolytes.  Lipase is normal.  Findings discussed  with the patient.  After fluids, Protonix, pain and nausea medicine he reports his pain is almost completely resolved.  Discussed with him trial of PPI at home, antiemetics and Carafate with dietary changes.  He is comfortable with this plan.  He was given return precautions and follow-up information for gastroenterology.         Final  Clinical Impression(s) / ED Diagnoses Final diagnoses:  PUD (peptic ulcer disease)    Rx / DC Orders ED Discharge Orders          Ordered    pantoprazole (PROTONIX) 40 MG tablet  Daily        04/20/23 1649    sucralfate (CARAFATE) 1 g tablet  3 times daily with meals & bedtime        04/20/23 1649    promethazine (PHENERGAN) 25 MG tablet  Every 6 hours PRN        04/20/23 1650              Gwyneth Sprout, MD 04/20/23 1652

## 2023-04-20 NOTE — ED Triage Notes (Signed)
Pt arrived reporting abdominal pain, vomiting that started last night. Patient states he was here and signed out AMA. Dx with ulcers. No other symptoms reported.

## 2023-04-20 NOTE — ED Notes (Signed)
Pt is tolerating PO fluids and food with out nausea/vomiting.

## 2023-04-20 NOTE — Discharge Instructions (Signed)
Continue taking the Protonix you can take the next dose tomorrow because you have had your dose today.  You can use the Carafate as early as this evening.  Avoid all spicy foods and acidic foods.  Avoid aspirin, ibuprofen and alcohol.  Do small frequent amounts of food.  Return to the emergency room if symptoms worsen.

## 2023-09-14 ENCOUNTER — Emergency Department (HOSPITAL_COMMUNITY): Payer: Self-pay

## 2023-09-14 ENCOUNTER — Other Ambulatory Visit (HOSPITAL_COMMUNITY): Payer: Self-pay

## 2023-09-14 ENCOUNTER — Other Ambulatory Visit: Payer: Self-pay

## 2023-09-14 ENCOUNTER — Encounter (HOSPITAL_COMMUNITY): Payer: Self-pay

## 2023-09-14 ENCOUNTER — Emergency Department (HOSPITAL_COMMUNITY)
Admission: EM | Admit: 2023-09-14 | Discharge: 2023-09-14 | Disposition: A | Discharge status: Home / Self Care | Payer: Self-pay | Attending: Emergency Medicine | Admitting: Emergency Medicine

## 2023-09-14 DIAGNOSIS — Z79899 Other long term (current) drug therapy: Secondary | ICD-10-CM | POA: Insufficient documentation

## 2023-09-14 DIAGNOSIS — R519 Headache, unspecified: Secondary | ICD-10-CM

## 2023-09-14 DIAGNOSIS — D696 Thrombocytopenia, unspecified: Secondary | ICD-10-CM | POA: Insufficient documentation

## 2023-09-14 DIAGNOSIS — I10 Essential (primary) hypertension: Secondary | ICD-10-CM | POA: Insufficient documentation

## 2023-09-14 DIAGNOSIS — E871 Hypo-osmolality and hyponatremia: Secondary | ICD-10-CM | POA: Insufficient documentation

## 2023-09-14 DIAGNOSIS — R739 Hyperglycemia, unspecified: Secondary | ICD-10-CM | POA: Insufficient documentation

## 2023-09-14 HISTORY — DX: Essential (primary) hypertension: I10

## 2023-09-14 HISTORY — DX: Gastric ulcer, unspecified as acute or chronic, without hemorrhage or perforation: K25.9

## 2023-09-14 LAB — CBC WITH DIFFERENTIAL/PLATELET
Abs Immature Granulocytes: 0.03 10*3/uL (ref 0.00–0.07)
Basophils Absolute: 0 10*3/uL (ref 0.0–0.1)
Basophils Relative: 0 %
Eosinophils Absolute: 0 10*3/uL (ref 0.0–0.5)
Eosinophils Relative: 0 %
HCT: 43.9 % (ref 39.0–52.0)
Hemoglobin: 15 g/dL (ref 13.0–17.0)
Immature Granulocytes: 1 %
Lymphocytes Relative: 15 %
Lymphs Abs: 0.9 10*3/uL (ref 0.7–4.0)
MCH: 30.8 pg (ref 26.0–34.0)
MCHC: 34.2 g/dL (ref 30.0–36.0)
MCV: 90.1 fL (ref 80.0–100.0)
Monocytes Absolute: 0.8 10*3/uL (ref 0.1–1.0)
Monocytes Relative: 13 %
Neutro Abs: 4.2 10*3/uL (ref 1.7–7.7)
Neutrophils Relative %: 71 %
Platelets: 146 10*3/uL — ABNORMAL LOW (ref 150–400)
RBC: 4.87 MIL/uL (ref 4.22–5.81)
RDW: 13.1 % (ref 11.5–15.5)
WBC: 5.9 10*3/uL (ref 4.0–10.5)
nRBC: 0 % (ref 0.0–0.2)

## 2023-09-14 LAB — BASIC METABOLIC PANEL WITH GFR
Anion gap: 10 (ref 5–15)
BUN: 7 mg/dL (ref 6–20)
CO2: 22 mmol/L (ref 22–32)
Calcium: 8.2 mg/dL — ABNORMAL LOW (ref 8.9–10.3)
Chloride: 102 mmol/L (ref 98–111)
Creatinine, Ser: 0.62 mg/dL (ref 0.61–1.24)
GFR, Estimated: >60 mL/min (ref >60)
Glucose, Bld: 136 mg/dL — ABNORMAL HIGH (ref 70–99)
Potassium: 3.5 mmol/L (ref 3.5–5.1)
Sodium: 134 mmol/L — ABNORMAL LOW (ref 135–145)

## 2023-09-14 MED ORDER — HYDROCHLOROTHIAZIDE 25 MG PO TABS
25.0000 mg | ORAL_TABLET | Freq: Every day | ORAL | 0 refills | Status: AC
  Filled 2023-09-14: qty 30, 30d supply, fill #0

## 2023-09-14 MED ORDER — KETOROLAC TROMETHAMINE 30 MG/ML IJ SOLN
15.0000 mg | Freq: Once | INTRAMUSCULAR | Status: AC
  Administered 2023-09-14: 15 mg via INTRAVENOUS
  Filled 2023-09-14: qty 1

## 2023-09-14 MED ORDER — HYDROCHLOROTHIAZIDE 12.5 MG PO TABS
12.5000 mg | ORAL_TABLET | Freq: Once | ORAL | Status: AC
  Administered 2023-09-14: 12.5 mg via ORAL
  Filled 2023-09-14: qty 1

## 2023-09-14 MED ORDER — SODIUM CHLORIDE 0.9 % IV BOLUS
1000.0000 mL | Freq: Once | INTRAVENOUS | Status: AC
  Administered 2023-09-14: 1000 mL via INTRAVENOUS

## 2023-09-14 MED ORDER — DIPHENHYDRAMINE HCL 50 MG/ML IJ SOLN
12.5000 mg | Freq: Once | INTRAMUSCULAR | Status: AC
  Administered 2023-09-14: 12.5 mg via INTRAVENOUS
  Filled 2023-09-14: qty 1

## 2023-09-14 MED ORDER — METOCLOPRAMIDE HCL 5 MG/ML IJ SOLN
10.0000 mg | Freq: Once | INTRAMUSCULAR | Status: AC
  Administered 2023-09-14: 10 mg via INTRAVENOUS
  Filled 2023-09-14: qty 2

## 2023-09-14 NOTE — ED Triage Notes (Signed)
 Pt reports sudden onset headache 3-4 days ago. Started while resting. Pt denies history of migraines but reports sensitivity to light and sound. Denies vision changes/N/V. Pt reports pressure in the front of his head and has been very diaphoretic. Pt awake and alert, A&Ox4. Hx of HTN. Currently 172/126

## 2023-09-14 NOTE — ED Provider Notes (Signed)
  EMERGENCY DEPARTMENT AT [facility] Provider Note   CSN: [id number] Arrival date & time: 09/14/23  4098     History  Chief Complaint  Patient presents with   Headache   HPI Jack Bush is a 38 y.o. male with history of hypertension presenting for headache.  Started 4 days ago.  States he was sitting in his car when he had a headache that came on all of a sudden.  Pain is primarily concentrated in a bandlike pattern across the forehead.  He does report a history of migraines but states this is much worse.  Endorsing sensitivity to light and sound.  Denies visual changes, dizziness or weakness or numbness in his extremities.  Denies excessive alcohol use or cocaine use.  Denies chest pain shortness of breath.  Denies nausea, vomiting, fever and nuchal rigidity.   Headache      Home Medications Prior to Admission medications   Medication Sig Start Date End Date Taking? Authorizing Provider  hydrochlorothiazide (HYDRODIURIL) 25 MG tablet Take 1 tablet (25 mg total) by mouth daily. 09/14/23 10/14/23 Yes Janalee Mcmurray, PA-C  pantoprazole  (PROTONIX ) 40 MG tablet Take 1 tablet (40 mg total) by mouth daily. 04/20/23   Almond Army, MD  promethazine  (PHENERGAN ) 25 MG tablet Take 1 tablet (25 mg total) by mouth every 6 (six) hours as needed for nausea or vomiting. 04/20/23   Almond Army, MD  sucralfate  (CARAFATE ) 1 g tablet Take 1 tablet (1 g total) by mouth 4 (four) times daily -  with meals and at bedtime. 04/20/23   Almond Army, MD      Allergies    Patient has no known allergies.    Review of Systems   Review of Systems  Neurological:  Positive for headaches.    Physical Exam   Vitals:   09/14/23 0615 09/14/23 0920  BP: (!) 152/104 (!) 150/104  Pulse: 97 93  Resp: 10 18  Temp:  98.1 F (36.7 C)  SpO2: 98% 100%    CONSTITUTIONAL:  well-appearing, NAD, diaphoretic NEURO:  GCS 15. Speech is goal oriented. No deficits appreciated to CN  III-XII; symmetric eyebrow raise, no facial drooping, tongue midline. Patient has equal grip strength bilaterally with 5/5 strength against resistance in all major muscle groups bilaterally. Sensation to light touch intact. Patient moves extremities without ataxia. Normal finger-nose-finger. Patient ambulatory with steady gait. EYES:  eyes equal and reactive ENT/NECK:  Supple, no stridor  CARDIO:  tachycardic and regular rhythm, appears well-perfused  PULM:  No respiratory distress, CTAB GI/GU:  non-distended, soft MSK/SPINE:  No gross deformities, no edema, moves all extremities  SKIN:  no rash, atraumatic   *Additional and/or pertinent findings included in MDM below    ED Results / Procedures / Treatments   Labs (all labs ordered are listed, but only abnormal results are displayed) Labs Reviewed  CBC WITH DIFFERENTIAL/PLATELET - Abnormal; Notable for the following components:      Result Value   Platelets 146 (*)    All other components within normal limits  BASIC METABOLIC PANEL WITH GFR - Abnormal; Notable for the following components:   Sodium 134 (*)    Glucose, Bld 136 (*)    Calcium 8.2 (*)    All other components within normal limits    EKG None  Radiology CT Head Wo Contrast Result Date: 09/14/2023 CLINICAL DATA:  Headache, sudden severe EXAM: CT HEAD WITHOUT CONTRAST TECHNIQUE: Contiguous axial images were obtained from the base of  the skull through the vertex without intravenous contrast. RADIATION DOSE REDUCTION: This exam was performed according to the departmental dose-optimization program which includes automated exposure control, adjustment of the mA and/or kV according to patient size and/or use of iterative reconstruction technique. COMPARISON:  07/03/2012 FINDINGS: Brain: No evidence of acute infarction, hemorrhage, hydrocephalus, extra-axial collection or mass lesion/mass effect. Vascular: No hyperdense vessel or unexpected calcification. Vertebrobasilar  tortuosity, unchanged. Skull: Normal. Negative for fracture or focal lesion. Sinuses/Orbits: Partial bilateral mastoid opacification IMPRESSION: Normal appearance of the brain. Partial mastoid opacification on both sides with normal nasopharynx. Electronically Signed   By: Ronnette Coke M.D.   On: 09/14/2023 07:13    Procedures Procedures    Medications Ordered in ED Medications  hydrochlorothiazide (HYDRODIURIL) tablet 12.5 mg (has no administration in time range)  sodium chloride  0.9 % bolus 1,000 mL (1,000 mLs Intravenous New Bag/Given 09/14/23 0758)  metoCLOPramide  (REGLAN ) injection 10 mg (10 mg Intravenous Given 09/14/23 0757)  diphenhydrAMINE  (BENADRYL ) injection 12.5 mg (12.5 mg Intravenous Given 09/14/23 0755)  ketorolac (TORADOL) 30 MG/ML injection 15 mg (15 mg Intravenous Given 09/14/23 1610)    ED Course/ Medical Decision Making/ A&P Clinical Course as of 09/14/23 1037  Fri Sep 14, 2023  9604 EKG 12-Lead [JR]    Clinical Course User Index [JR] Janalee Mcmurray, PA-C                                 Medical Decision Making Amount and/or Complexity of Data Reviewed Labs: ordered. ECG/medicine tests:  Decision-making details documented in ED Course.  Risk Prescription drug management.   Initial Impression and Ddx 38 year old well-appearing male present for headache.  Exam notable for diaphoresis but otherwise reassuring.  DDx includes hypertensive emergency, meningitis, stroke, SAH, electrolyte derangement, complicated migraine, other. Patient PMH that increases complexity of ED encounter:  history of hypertension  Interpretation of Diagnostics I independent reviewed and interpreted the labs as followed: Mild hyperglycemia and mild hyponatremia, thrombocytopenia  - I independently visualized the following imaging with scope of interpretation limited to determining acute life threatening conditions related to emergency care: CT head, which revealed no findings  - I  personally Reviewed and interpreted EKG which revealed sinus rhythm  Patient Reassessment and Ultimate Disposition/Management On reassessment, he stated that headache is much improved.  Considered SAH but unlikely given reassuring CT scan and no focal neurodeficits on exam.  Considered meningitis but unlikely given lack of meningismal symptoms.  Blood pressure was noted to be elevated during this encounter.  Patient confirmed that he is not currently taking anything for his blood pressure.  Started him on hydrochlorothiazide.  Symptoms could be related to complicated migraine versus his poorly controlled hypertension.  Advised him to follow-up with his PCP.  Discussed return precautions.  Discharged in good condition.  Patient management required discussion with the following services or consulting groups:  None  Complexity of Problems Addressed Acute complicated illness or Injury  Additional Data Reviewed and Analyzed Further history obtained from: Past medical history and medications listed in the EMR and Prior ED visit notes  Patient Encounter Risk Assessment Prescriptions         Final Clinical Impression(s) / ED Diagnoses Final diagnoses:  Nonintractable headache, unspecified chronicity pattern, unspecified headache type  Uncontrolled hypertension    Rx / DC Orders ED Discharge Orders          Ordered    hydrochlorothiazide (HYDRODIURIL) 25 MG  tablet  Daily        09/14/23 1029              Zoila Hines 09/14/23 1037    Alissa April, MD 09/14/23 Waverly Hageman    Alissa April, MD 09/14/23 2245

## 2023-09-14 NOTE — Discharge Instructions (Addendum)
